# Patient Record
Sex: Female | Born: 1950
Health system: Southern US, Community
[De-identification: ages and names within clinical notes are randomized; demographics above are authoritative.]

## PROBLEM LIST (undated history)

## (undated) DIAGNOSIS — M810 Age-related osteoporosis without current pathological fracture: Secondary | ICD-10-CM

## (undated) DIAGNOSIS — E7800 Pure hypercholesterolemia, unspecified: Secondary | ICD-10-CM

## (undated) DIAGNOSIS — F419 Anxiety disorder, unspecified: Secondary | ICD-10-CM

## (undated) DIAGNOSIS — M1990 Unspecified osteoarthritis, unspecified site: Secondary | ICD-10-CM

## (undated) DIAGNOSIS — J189 Pneumonia, unspecified organism: Secondary | ICD-10-CM

## (undated) DIAGNOSIS — R7303 Prediabetes: Secondary | ICD-10-CM

## (undated) DIAGNOSIS — K219 Gastro-esophageal reflux disease without esophagitis: Secondary | ICD-10-CM

## (undated) HISTORY — DX: Pure hypercholesterolemia, unspecified: E78.00

## (undated) HISTORY — PX: FOOT SURGERY: SHX648

## (undated) HISTORY — PX: FINGER SURGERY: SHX640

## (undated) HISTORY — PX: PLANTAR FASCIA RELEASE: SHX2239

## (undated) HISTORY — DX: Age-related osteoporosis without current pathological fracture: M81.0

---

## 2000-06-01 ENCOUNTER — Other Ambulatory Visit: Admission: RE | Admit: 2000-06-01 | Discharge: 2000-06-01 | Payer: Self-pay | Admitting: Obstetrics and Gynecology

## 2001-05-03 ENCOUNTER — Other Ambulatory Visit: Admission: RE | Admit: 2001-05-03 | Discharge: 2001-05-03 | Payer: Self-pay | Admitting: Obstetrics and Gynecology

## 2001-07-06 ENCOUNTER — Encounter: Payer: Self-pay | Admitting: Emergency Medicine

## 2001-07-06 ENCOUNTER — Observation Stay (HOSPITAL_COMMUNITY): Admission: EM | Admit: 2001-07-06 | Discharge: 2001-07-07 | Payer: Self-pay | Admitting: Emergency Medicine

## 2002-08-13 ENCOUNTER — Other Ambulatory Visit: Admission: RE | Admit: 2002-08-13 | Discharge: 2002-08-13 | Payer: Self-pay | Admitting: Obstetrics and Gynecology

## 2003-08-19 ENCOUNTER — Other Ambulatory Visit: Admission: RE | Admit: 2003-08-19 | Discharge: 2003-08-19 | Payer: Self-pay | Admitting: Obstetrics and Gynecology

## 2004-08-20 ENCOUNTER — Other Ambulatory Visit: Admission: RE | Admit: 2004-08-20 | Discharge: 2004-08-20 | Payer: Self-pay | Admitting: Obstetrics and Gynecology

## 2005-08-22 ENCOUNTER — Other Ambulatory Visit: Admission: RE | Admit: 2005-08-22 | Discharge: 2005-08-22 | Payer: Self-pay | Admitting: Obstetrics and Gynecology

## 2010-11-17 ENCOUNTER — Other Ambulatory Visit: Payer: Self-pay | Admitting: Obstetrics and Gynecology

## 2010-11-17 ENCOUNTER — Ambulatory Visit
Admission: RE | Admit: 2010-11-17 | Discharge: 2010-11-17 | Disposition: A | Discharge status: Home / Self Care | Payer: BC Managed Care – PPO | Source: Ambulatory Visit | Attending: Obstetrics and Gynecology | Admitting: Obstetrics and Gynecology

## 2010-11-17 DIAGNOSIS — Z1231 Encounter for screening mammogram for malignant neoplasm of breast: Secondary | ICD-10-CM

## 2011-06-02 ENCOUNTER — Encounter: Payer: Self-pay | Admitting: Internal Medicine

## 2011-06-24 ENCOUNTER — Ambulatory Visit: Payer: BC Managed Care – PPO | Admitting: Internal Medicine

## 2011-11-21 ENCOUNTER — Other Ambulatory Visit: Payer: Self-pay | Admitting: Obstetrics and Gynecology

## 2011-11-21 DIAGNOSIS — Z1231 Encounter for screening mammogram for malignant neoplasm of breast: Secondary | ICD-10-CM

## 2011-12-09 ENCOUNTER — Ambulatory Visit: Payer: BC Managed Care – PPO

## 2011-12-20 ENCOUNTER — Ambulatory Visit
Admission: RE | Admit: 2011-12-20 | Discharge: 2011-12-20 | Disposition: A | Discharge status: Home / Self Care | Payer: BC Managed Care – PPO | Source: Ambulatory Visit | Attending: Obstetrics and Gynecology | Admitting: Obstetrics and Gynecology

## 2011-12-20 DIAGNOSIS — Z1231 Encounter for screening mammogram for malignant neoplasm of breast: Secondary | ICD-10-CM

## 2012-11-14 ENCOUNTER — Other Ambulatory Visit: Payer: Self-pay

## 2012-11-14 DIAGNOSIS — Z1231 Encounter for screening mammogram for malignant neoplasm of breast: Secondary | ICD-10-CM

## 2012-12-21 ENCOUNTER — Ambulatory Visit: Payer: BC Managed Care – PPO

## 2012-12-26 ENCOUNTER — Ambulatory Visit
Admission: RE | Admit: 2012-12-26 | Discharge: 2012-12-26 | Disposition: A | Discharge status: Home / Self Care | Payer: BC Managed Care – PPO | Source: Ambulatory Visit

## 2012-12-26 DIAGNOSIS — Z1231 Encounter for screening mammogram for malignant neoplasm of breast: Secondary | ICD-10-CM

## 2013-10-31 DIAGNOSIS — H25019 Cortical age-related cataract, unspecified eye: Secondary | ICD-10-CM | POA: Diagnosis not present

## 2013-10-31 DIAGNOSIS — H2510 Age-related nuclear cataract, unspecified eye: Secondary | ICD-10-CM | POA: Diagnosis not present

## 2013-10-31 DIAGNOSIS — H5210 Myopia, unspecified eye: Secondary | ICD-10-CM | POA: Diagnosis not present

## 2013-11-04 DIAGNOSIS — R12 Heartburn: Secondary | ICD-10-CM | POA: Diagnosis not present

## 2013-12-20 DIAGNOSIS — K2970 Gastritis, unspecified, without bleeding: Secondary | ICD-10-CM | POA: Diagnosis not present

## 2013-12-20 DIAGNOSIS — R12 Heartburn: Secondary | ICD-10-CM | POA: Diagnosis not present

## 2013-12-20 DIAGNOSIS — K2990 Gastroduodenitis, unspecified, without bleeding: Secondary | ICD-10-CM | POA: Diagnosis not present

## 2013-12-23 DIAGNOSIS — E782 Mixed hyperlipidemia: Secondary | ICD-10-CM | POA: Diagnosis not present

## 2013-12-23 DIAGNOSIS — F411 Generalized anxiety disorder: Secondary | ICD-10-CM | POA: Diagnosis not present

## 2013-12-23 DIAGNOSIS — F3289 Other specified depressive episodes: Secondary | ICD-10-CM | POA: Diagnosis not present

## 2013-12-23 DIAGNOSIS — Z23 Encounter for immunization: Secondary | ICD-10-CM | POA: Diagnosis not present

## 2013-12-23 DIAGNOSIS — F329 Major depressive disorder, single episode, unspecified: Secondary | ICD-10-CM | POA: Diagnosis not present

## 2013-12-23 DIAGNOSIS — Z1331 Encounter for screening for depression: Secondary | ICD-10-CM | POA: Diagnosis not present

## 2013-12-23 DIAGNOSIS — Z0000 Encounter for general adult medical examination without abnormal findings: Secondary | ICD-10-CM | POA: Diagnosis not present

## 2014-01-01 DIAGNOSIS — Z1231 Encounter for screening mammogram for malignant neoplasm of breast: Secondary | ICD-10-CM | POA: Diagnosis not present

## 2014-01-01 DIAGNOSIS — M899 Disorder of bone, unspecified: Secondary | ICD-10-CM | POA: Diagnosis not present

## 2014-01-01 DIAGNOSIS — Z803 Family history of malignant neoplasm of breast: Secondary | ICD-10-CM | POA: Diagnosis not present

## 2014-01-01 DIAGNOSIS — M949 Disorder of cartilage, unspecified: Secondary | ICD-10-CM | POA: Diagnosis not present

## 2014-01-14 DIAGNOSIS — M899 Disorder of bone, unspecified: Secondary | ICD-10-CM | POA: Diagnosis not present

## 2014-01-14 DIAGNOSIS — M949 Disorder of cartilage, unspecified: Secondary | ICD-10-CM | POA: Diagnosis not present

## 2014-01-14 DIAGNOSIS — Z23 Encounter for immunization: Secondary | ICD-10-CM | POA: Diagnosis not present

## 2014-04-03 DIAGNOSIS — M722 Plantar fascial fibromatosis: Secondary | ICD-10-CM | POA: Diagnosis not present

## 2014-04-03 DIAGNOSIS — M79672 Pain in left foot: Secondary | ICD-10-CM | POA: Diagnosis not present

## 2014-05-13 DIAGNOSIS — F329 Major depressive disorder, single episode, unspecified: Secondary | ICD-10-CM | POA: Diagnosis not present

## 2014-05-13 DIAGNOSIS — F419 Anxiety disorder, unspecified: Secondary | ICD-10-CM | POA: Diagnosis not present

## 2014-05-13 DIAGNOSIS — E782 Mixed hyperlipidemia: Secondary | ICD-10-CM | POA: Diagnosis not present

## 2014-06-10 DIAGNOSIS — E782 Mixed hyperlipidemia: Secondary | ICD-10-CM | POA: Diagnosis not present

## 2014-06-10 DIAGNOSIS — F329 Major depressive disorder, single episode, unspecified: Secondary | ICD-10-CM | POA: Diagnosis not present

## 2014-08-29 DIAGNOSIS — M65351 Trigger finger, right little finger: Secondary | ICD-10-CM | POA: Diagnosis not present

## 2014-10-23 DIAGNOSIS — Z1211 Encounter for screening for malignant neoplasm of colon: Secondary | ICD-10-CM | POA: Diagnosis not present

## 2014-10-23 DIAGNOSIS — R12 Heartburn: Secondary | ICD-10-CM | POA: Diagnosis not present

## 2014-12-19 DIAGNOSIS — Z1211 Encounter for screening for malignant neoplasm of colon: Secondary | ICD-10-CM | POA: Diagnosis not present

## 2014-12-19 DIAGNOSIS — D122 Benign neoplasm of ascending colon: Secondary | ICD-10-CM | POA: Diagnosis not present

## 2014-12-19 DIAGNOSIS — D126 Benign neoplasm of colon, unspecified: Secondary | ICD-10-CM | POA: Diagnosis not present

## 2015-01-02 DIAGNOSIS — M65351 Trigger finger, right little finger: Secondary | ICD-10-CM | POA: Diagnosis not present

## 2015-01-02 DIAGNOSIS — R2231 Localized swelling, mass and lump, right upper limb: Secondary | ICD-10-CM | POA: Diagnosis not present

## 2015-01-07 DIAGNOSIS — Z1231 Encounter for screening mammogram for malignant neoplasm of breast: Secondary | ICD-10-CM | POA: Diagnosis not present

## 2015-01-14 DIAGNOSIS — R2231 Localized swelling, mass and lump, right upper limb: Secondary | ICD-10-CM | POA: Diagnosis not present

## 2015-01-14 DIAGNOSIS — D481 Neoplasm of uncertain behavior of connective and other soft tissue: Secondary | ICD-10-CM | POA: Diagnosis not present

## 2015-01-14 DIAGNOSIS — Z23 Encounter for immunization: Secondary | ICD-10-CM | POA: Diagnosis not present

## 2015-01-14 DIAGNOSIS — M65351 Trigger finger, right little finger: Secondary | ICD-10-CM | POA: Diagnosis not present

## 2015-01-16 DIAGNOSIS — H04123 Dry eye syndrome of bilateral lacrimal glands: Secondary | ICD-10-CM | POA: Diagnosis not present

## 2015-01-16 DIAGNOSIS — H5213 Myopia, bilateral: Secondary | ICD-10-CM | POA: Diagnosis not present

## 2015-01-16 DIAGNOSIS — H25013 Cortical age-related cataract, bilateral: Secondary | ICD-10-CM | POA: Diagnosis not present

## 2015-01-16 DIAGNOSIS — H2513 Age-related nuclear cataract, bilateral: Secondary | ICD-10-CM | POA: Diagnosis not present

## 2015-01-26 DIAGNOSIS — Z4789 Encounter for other orthopedic aftercare: Secondary | ICD-10-CM | POA: Diagnosis not present

## 2015-02-03 DIAGNOSIS — M7582 Other shoulder lesions, left shoulder: Secondary | ICD-10-CM | POA: Diagnosis not present

## 2015-02-03 DIAGNOSIS — M7502 Adhesive capsulitis of left shoulder: Secondary | ICD-10-CM | POA: Diagnosis not present

## 2015-02-03 DIAGNOSIS — M7552 Bursitis of left shoulder: Secondary | ICD-10-CM | POA: Diagnosis not present

## 2015-02-03 DIAGNOSIS — M25512 Pain in left shoulder: Secondary | ICD-10-CM | POA: Diagnosis not present

## 2015-02-05 DIAGNOSIS — M7582 Other shoulder lesions, left shoulder: Secondary | ICD-10-CM | POA: Diagnosis not present

## 2015-02-09 DIAGNOSIS — M7582 Other shoulder lesions, left shoulder: Secondary | ICD-10-CM | POA: Diagnosis not present

## 2015-02-24 DIAGNOSIS — E782 Mixed hyperlipidemia: Secondary | ICD-10-CM | POA: Diagnosis not present

## 2015-02-24 DIAGNOSIS — M810 Age-related osteoporosis without current pathological fracture: Secondary | ICD-10-CM | POA: Diagnosis not present

## 2015-02-24 DIAGNOSIS — F329 Major depressive disorder, single episode, unspecified: Secondary | ICD-10-CM | POA: Diagnosis not present

## 2015-02-24 DIAGNOSIS — F419 Anxiety disorder, unspecified: Secondary | ICD-10-CM | POA: Diagnosis not present

## 2015-02-24 DIAGNOSIS — Z0000 Encounter for general adult medical examination without abnormal findings: Secondary | ICD-10-CM | POA: Diagnosis not present

## 2015-02-26 DIAGNOSIS — Z4789 Encounter for other orthopedic aftercare: Secondary | ICD-10-CM | POA: Diagnosis not present

## 2015-03-03 DIAGNOSIS — M7502 Adhesive capsulitis of left shoulder: Secondary | ICD-10-CM | POA: Diagnosis not present

## 2015-03-03 DIAGNOSIS — M25512 Pain in left shoulder: Secondary | ICD-10-CM | POA: Diagnosis not present

## 2015-03-03 DIAGNOSIS — M7552 Bursitis of left shoulder: Secondary | ICD-10-CM | POA: Diagnosis not present

## 2015-03-12 DIAGNOSIS — M25512 Pain in left shoulder: Secondary | ICD-10-CM | POA: Diagnosis not present

## 2015-03-17 DIAGNOSIS — M7502 Adhesive capsulitis of left shoulder: Secondary | ICD-10-CM | POA: Diagnosis not present

## 2015-04-09 DIAGNOSIS — Z4789 Encounter for other orthopedic aftercare: Secondary | ICD-10-CM | POA: Diagnosis not present

## 2015-04-09 DIAGNOSIS — M65351 Trigger finger, right little finger: Secondary | ICD-10-CM | POA: Diagnosis not present

## 2015-04-14 DIAGNOSIS — M7542 Impingement syndrome of left shoulder: Secondary | ICD-10-CM | POA: Diagnosis not present

## 2015-04-14 DIAGNOSIS — S43432D Superior glenoid labrum lesion of left shoulder, subsequent encounter: Secondary | ICD-10-CM | POA: Diagnosis not present

## 2015-04-14 DIAGNOSIS — M7502 Adhesive capsulitis of left shoulder: Secondary | ICD-10-CM | POA: Diagnosis not present

## 2015-05-13 DIAGNOSIS — M7502 Adhesive capsulitis of left shoulder: Secondary | ICD-10-CM | POA: Diagnosis not present

## 2015-05-13 DIAGNOSIS — S43432D Superior glenoid labrum lesion of left shoulder, subsequent encounter: Secondary | ICD-10-CM | POA: Diagnosis not present

## 2015-05-26 NOTE — H&P (Signed)
  [name] is an 65 y.o. female.    Chief Complaint: left shoulder pain  HPI: Pt is a 65 y.o. female complaining of left shoulder pain for multiple months. Pain had continually increased since the beginning. X-rays in the clinic show SLAP tear left shoulder. Pt has tried various conservative treatments which have failed to alleviate their symptoms, including injections and therapy. Various options are discussed with the patient. Risks, benefits and expectations were discussed with the patient. Patient understand the risks, benefits and expectations and wishes to proceed with surgery.   PCP:  No primary care provider on file.  D/C Plans: Home  PMH: No past medical history on file.  PSH: No past surgical history on file.  Social History:  has no tobacco, alcohol, and drug history on file.  Allergies:  No Known Allergies  Medications: No current facility-administered medications for this encounter.   No current outpatient prescriptions on file.    No results found for this or any previous visit (from the past 48 hour(s)). No results found.  ROS: Pain with rom of the left upper extremity  Physical Exam:  Alert and oriented 65 y.o. female in no acute distress Cranial nerves 2-12 intact Cervical spine: full rom with no tenderness, nv intact distally Chest: active breath sounds bilaterally, no wheeze rhonchi or rales Heart: regular rate and rhythm, no murmur Abd: non tender non distended with active bowel sounds Hip is stable with rom  Left shoulder with good rom nv intact distally Strength of ER and IR 5/5 bilaterally Positive obrien's test  Assessment/Plan Assessment: left shoulder SLAP tear  Plan: Patient will undergo a left shoulder scope and tenodesis by Dr. Veverly Fells at [facility] Risks benefits and expectations were discussed with the patient. Patient understand risks, benefits and expectations and wishes to proceed.

## 2015-06-02 ENCOUNTER — Encounter (HOSPITAL_COMMUNITY): Payer: Self-pay

## 2015-06-02 ENCOUNTER — Encounter (HOSPITAL_COMMUNITY)
Admission: RE | Admit: 2015-06-02 | Discharge: 2015-06-02 | Disposition: A | Discharge status: Home / Self Care | Payer: Medicare Other | Source: Ambulatory Visit | Attending: Orthopedic Surgery | Admitting: Orthopedic Surgery

## 2015-06-02 DIAGNOSIS — Z01812 Encounter for preprocedural laboratory examination: Secondary | ICD-10-CM | POA: Diagnosis not present

## 2015-06-02 DIAGNOSIS — S43432A Superior glenoid labrum lesion of left shoulder, initial encounter: Secondary | ICD-10-CM | POA: Insufficient documentation

## 2015-06-02 DIAGNOSIS — X58XXXA Exposure to other specified factors, initial encounter: Secondary | ICD-10-CM | POA: Insufficient documentation

## 2015-06-02 HISTORY — DX: Unspecified osteoarthritis, unspecified site: M19.90

## 2015-06-02 HISTORY — DX: Anxiety disorder, unspecified: F41.9

## 2015-06-02 HISTORY — DX: Gastro-esophageal reflux disease without esophagitis: K21.9

## 2015-06-02 LAB — CBC
HCT: 42.2 % (ref 36.0–46.0)
Hemoglobin: 13.3 g/dL (ref 12.0–15.0)
MCH: 27.7 pg (ref 26.0–34.0)
MCHC: 31.5 g/dL (ref 30.0–36.0)
MCV: 87.9 fL (ref 78.0–100.0)
Platelets: 202 10*3/uL (ref 150–400)
RBC: 4.8 MIL/uL (ref 3.87–5.11)
RDW: 13.8 % (ref 11.5–15.5)
WBC: 7.3 10*3/uL (ref 4.0–10.5)

## 2015-06-02 LAB — BASIC METABOLIC PANEL
Anion gap: 9 (ref 5–15)
BUN: 19 mg/dL (ref 6–20)
CALCIUM: 9 mg/dL (ref 8.9–10.3)
CO2: 27 mmol/L (ref 22–32)
CREATININE: 0.85 mg/dL (ref 0.44–1.00)
Chloride: 108 mmol/L (ref 101–111)
GFR calc non Af Amer: >60 mL/min (ref >60)
Glucose, Bld: 109 mg/dL — ABNORMAL HIGH (ref 65–99)
Potassium: 3.8 mmol/L (ref 3.5–5.1)
SODIUM: 144 mmol/L (ref 135–145)

## 2015-06-11 MED ORDER — CEFAZOLIN SODIUM-DEXTROSE 2-3 GM-% IV SOLR
2.0000 g | INTRAVENOUS | Status: AC
  Administered 2015-06-12: 2 g via INTRAVENOUS
  Filled 2015-06-11: qty 50

## 2015-06-11 MED ORDER — CHLORHEXIDINE GLUCONATE 4 % EX LIQD: 60.0000 mL | Freq: Once | CUTANEOUS | Status: DC

## 2015-06-12 ENCOUNTER — Ambulatory Visit (HOSPITAL_COMMUNITY): Payer: Medicare Other | Admitting: Anesthesiology

## 2015-06-12 ENCOUNTER — Encounter (HOSPITAL_COMMUNITY)
Admission: RE | Disposition: A | Discharge status: Home / Self Care | Payer: Self-pay | Source: Ambulatory Visit | Attending: Orthopedic Surgery

## 2015-06-12 ENCOUNTER — Encounter (HOSPITAL_COMMUNITY): Payer: Self-pay | Admitting: *Deleted

## 2015-06-12 ENCOUNTER — Ambulatory Visit (HOSPITAL_COMMUNITY)
Admission: RE | Admit: 2015-06-12 | Discharge: 2015-06-12 | Disposition: A | Discharge status: Home / Self Care | Payer: Medicare Other | Source: Ambulatory Visit | Attending: Orthopedic Surgery | Admitting: Orthopedic Surgery

## 2015-06-12 DIAGNOSIS — M75112 Incomplete rotator cuff tear or rupture of left shoulder, not specified as traumatic: Secondary | ICD-10-CM | POA: Insufficient documentation

## 2015-06-12 DIAGNOSIS — M7542 Impingement syndrome of left shoulder: Secondary | ICD-10-CM | POA: Diagnosis not present

## 2015-06-12 DIAGNOSIS — S43432A Superior glenoid labrum lesion of left shoulder, initial encounter: Secondary | ICD-10-CM | POA: Insufficient documentation

## 2015-06-12 DIAGNOSIS — M19012 Primary osteoarthritis, left shoulder: Secondary | ICD-10-CM | POA: Diagnosis not present

## 2015-06-12 DIAGNOSIS — X58XXXA Exposure to other specified factors, initial encounter: Secondary | ICD-10-CM | POA: Insufficient documentation

## 2015-06-12 DIAGNOSIS — G8918 Other acute postprocedural pain: Secondary | ICD-10-CM | POA: Diagnosis not present

## 2015-06-12 DIAGNOSIS — M75102 Unspecified rotator cuff tear or rupture of left shoulder, not specified as traumatic: Secondary | ICD-10-CM | POA: Diagnosis not present

## 2015-06-12 HISTORY — PX: SHOULDER ARTHROSCOPY WITH SUBACROMIAL DECOMPRESSION AND BICEP TENDON REPAIR: SHX5689

## 2015-06-12 SURGERY — SHOULDER ARTHROSCOPY WITH SUBACROMIAL DECOMPRESSION AND BICEP TENDON REPAIR
Anesthesia: General | Site: Shoulder | Laterality: Left

## 2015-06-12 MED ORDER — PROPOFOL 10 MG/ML IV BOLUS
INTRAVENOUS | Status: DC | PRN
  Administered 2015-06-12: 140 mg via INTRAVENOUS
  Administered 2015-06-12: 60 mg via INTRAVENOUS

## 2015-06-12 MED ORDER — BUPIVACAINE-EPINEPHRINE (PF) 0.5% -1:200000 IJ SOLN
INTRAMUSCULAR | Status: DC | PRN
  Administered 2015-06-12: 20 mL via PERINEURAL

## 2015-06-12 MED ORDER — FENTANYL CITRATE (PF) 100 MCG/2ML IJ SOLN
INTRAMUSCULAR | Status: AC
  Filled 2015-06-12: qty 2

## 2015-06-12 MED ORDER — MIDAZOLAM HCL 2 MG/2ML IJ SOLN
INTRAMUSCULAR | Status: AC
  Administered 2015-06-12: 1 mg
  Filled 2015-06-12: qty 2

## 2015-06-12 MED ORDER — PROMETHAZINE HCL 25 MG/ML IJ SOLN: 6.2500 mg | INTRAMUSCULAR | Status: DC | PRN

## 2015-06-12 MED ORDER — FENTANYL CITRATE (PF) 100 MCG/2ML IJ SOLN
50.0000 ug | Freq: Once | INTRAMUSCULAR | Status: AC
  Administered 2015-06-12: 50 ug via INTRAVENOUS

## 2015-06-12 MED ORDER — DEXAMETHASONE SODIUM PHOSPHATE 4 MG/ML IJ SOLN
INTRAMUSCULAR | Status: AC
  Filled 2015-06-12: qty 1

## 2015-06-12 MED ORDER — METHOCARBAMOL 500 MG PO TABS: 500.0000 mg | ORAL_TABLET | Freq: Three times a day (TID) | ORAL | Status: DC | PRN

## 2015-06-12 MED ORDER — BUPIVACAINE-EPINEPHRINE 0.25% -1:200000 IJ SOLN
INTRAMUSCULAR | Status: DC | PRN
  Administered 2015-06-12: 6 mL

## 2015-06-12 MED ORDER — FENTANYL CITRATE (PF) 100 MCG/2ML IJ SOLN: 25.0000 ug | INTRAMUSCULAR | Status: DC | PRN

## 2015-06-12 MED ORDER — LIDOCAINE HCL (CARDIAC) 20 MG/ML IV SOLN
INTRAVENOUS | Status: DC | PRN
  Administered 2015-06-12: 80 mg via INTRAVENOUS

## 2015-06-12 MED ORDER — ONDANSETRON HCL 4 MG/2ML IJ SOLN
INTRAMUSCULAR | Status: DC | PRN
  Administered 2015-06-12: 15 mg via INTRAVENOUS

## 2015-06-12 MED ORDER — BUPIVACAINE-EPINEPHRINE (PF) 0.25% -1:200000 IJ SOLN
INTRAMUSCULAR | Status: AC
  Filled 2015-06-12: qty 30

## 2015-06-12 MED ORDER — LIDOCAINE HCL (CARDIAC) 20 MG/ML IV SOLN
INTRAVENOUS | Status: AC
  Filled 2015-06-12: qty 5

## 2015-06-12 MED ORDER — NEOSTIGMINE METHYLSULFATE 10 MG/10ML IV SOLN
INTRAVENOUS | Status: DC | PRN
  Administered 2015-06-12: 3 mg via INTRAVENOUS

## 2015-06-12 MED ORDER — GLYCOPYRROLATE 0.2 MG/ML IJ SOLN
INTRAMUSCULAR | Status: AC
  Filled 2015-06-12: qty 2

## 2015-06-12 MED ORDER — PROPOFOL 10 MG/ML IV BOLUS
INTRAVENOUS | Status: AC
  Filled 2015-06-12: qty 20

## 2015-06-12 MED ORDER — NEOSTIGMINE METHYLSULFATE 10 MG/10ML IV SOLN
INTRAVENOUS | Status: AC
  Filled 2015-06-12: qty 1

## 2015-06-12 MED ORDER — ROCURONIUM BROMIDE 100 MG/10ML IV SOLN
INTRAVENOUS | Status: DC | PRN
  Administered 2015-06-12: 30 mg via INTRAVENOUS

## 2015-06-12 MED ORDER — KETOROLAC TROMETHAMINE 15 MG/ML IJ SOLN
INTRAMUSCULAR | Status: DC | PRN
  Administered 2015-06-12: 15 mg via INTRAVENOUS

## 2015-06-12 MED ORDER — DEXTROSE 5 % IV SOLN
10.0000 mg | INTRAVENOUS | Status: DC | PRN
  Administered 2015-06-12: 10 ug/min via INTRAVENOUS

## 2015-06-12 MED ORDER — MIDAZOLAM HCL 2 MG/2ML IJ SOLN
2.0000 mg | Freq: Once | INTRAMUSCULAR | Status: DC
Start: 2015-06-12 — End: 2015-06-12

## 2015-06-12 MED ORDER — LACTATED RINGERS IV SOLN
INTRAVENOUS | Status: DC
  Administered 2015-06-12: 08:00:00 via INTRAVENOUS

## 2015-06-12 MED ORDER — MEPERIDINE HCL 25 MG/ML IJ SOLN: 6.2500 mg | INTRAMUSCULAR | Status: DC | PRN

## 2015-06-12 MED ORDER — GLYCOPYRROLATE 0.2 MG/ML IJ SOLN
INTRAMUSCULAR | Status: DC | PRN
  Administered 2015-06-12: .4 mg via INTRAVENOUS

## 2015-06-12 MED ORDER — OXYCODONE-ACETAMINOPHEN 5-325 MG PO TABS: 1.0000 | ORAL_TABLET | ORAL | Status: DC | PRN

## 2015-06-12 MED ORDER — ROCURONIUM BROMIDE 50 MG/5ML IV SOLN
INTRAVENOUS | Status: AC
  Filled 2015-06-12: qty 1

## 2015-06-12 MED ORDER — FENTANYL CITRATE (PF) 250 MCG/5ML IJ SOLN
INTRAMUSCULAR | Status: AC
  Filled 2015-06-12: qty 5

## 2015-06-12 MED ORDER — ONDANSETRON HCL 4 MG/2ML IJ SOLN
INTRAMUSCULAR | Status: AC
  Filled 2015-06-12: qty 2

## 2015-06-12 MED ORDER — LACTATED RINGERS IV SOLN
INTRAVENOUS | Status: DC | PRN
  Administered 2015-06-12 (×2): via INTRAVENOUS

## 2015-06-12 MED ORDER — DEXAMETHASONE SODIUM PHOSPHATE 10 MG/ML IJ SOLN
INTRAMUSCULAR | Status: DC | PRN
  Administered 2015-06-12: 4 mg via INTRAVENOUS

## 2015-06-12 MED ORDER — SODIUM CHLORIDE 0.9 % IR SOLN
Status: DC | PRN
  Administered 2015-06-12: 6000 mL

## 2015-06-12 SURGICAL SUPPLY — 78 items
ANCH SUT 2 1.3X1 LD 1 STRN (Anchor) ×1 IMPLANT
ANCH SUT 360D 2 2 LD 2 STRN (Anchor) ×1 IMPLANT
ANCHOR ALL- SUT RC 2 SUT Y-K (Anchor) ×2 IMPLANT
ANCHOR ALL-SUT FLEX 1.3 Y-KNOT (Anchor) ×2 IMPLANT
ANCHOR ALL-SUT RC 2 SUT Y-K (Anchor) ×1 IMPLANT
BIT DRILL 1.3M DISPOSABLE (BIT) ×2 IMPLANT
BLADE LONG MED 31MMX9MM (MISCELLANEOUS)
BLADE LONG MED 31X9 (MISCELLANEOUS) IMPLANT
BLADE SURG 11 STRL SS (BLADE) ×3 IMPLANT
BUR OVAL 4.0 (BURR) ×3 IMPLANT
CLOSURE STERI-STRIP 1/2X4 (GAUZE/BANDAGES/DRESSINGS) ×1
CLOSURE WOUND 1/2 X4 (GAUZE/BANDAGES/DRESSINGS) ×1
CLSR STERI-STRIP ANTIMIC 1/2X4 (GAUZE/BANDAGES/DRESSINGS) ×1 IMPLANT
COVER SURGICAL LIGHT HANDLE (MISCELLANEOUS) ×3 IMPLANT
DRAPE INCISE IOBAN 66X45 STRL (DRAPES) ×3 IMPLANT
DRAPE STERI 35X30 U-POUCH (DRAPES) ×3 IMPLANT
DRAPE U-SHAPE 47X51 STRL (DRAPES) ×3 IMPLANT
DRILL BIT 5/64 (BIT) ×3 IMPLANT
DRSG EMULSION OIL 3X3 NADH (GAUZE/BANDAGES/DRESSINGS) ×3 IMPLANT
DRSG PAD ABDOMINAL 8X10 ST (GAUZE/BANDAGES/DRESSINGS) ×4 IMPLANT
DURAPREP 26ML APPLICATOR (WOUND CARE) ×3 IMPLANT
ELECT NDL TIP 2.8 STRL (NEEDLE) ×1 IMPLANT
ELECT NEEDLE TIP 2.8 STRL (NEEDLE) ×3 IMPLANT
ELECT REM PT RETURN 9FT ADLT (ELECTROSURGICAL)
ELECTRODE REM PT RTRN 9FT ADLT (ELECTROSURGICAL) IMPLANT
GAUZE SPONGE 4X4 12PLY STRL (GAUZE/BANDAGES/DRESSINGS) ×3 IMPLANT
GLOVE BIOGEL PI ORTHO PRO 7.5 (GLOVE) ×2
GLOVE BIOGEL PI ORTHO PRO SZ8 (GLOVE) ×2
GLOVE ORTHO TXT STRL SZ7.5 (GLOVE) ×3 IMPLANT
GLOVE PI ORTHO PRO STRL 7.5 (GLOVE) ×1 IMPLANT
GLOVE PI ORTHO PRO STRL SZ8 (GLOVE) ×1 IMPLANT
GLOVE SURG ORTHO 8.5 STRL (GLOVE) ×3 IMPLANT
GOWN STRL REUS W/ TWL LRG LVL3 (GOWN DISPOSABLE) ×2 IMPLANT
GOWN STRL REUS W/ TWL XL LVL3 (GOWN DISPOSABLE) ×2 IMPLANT
GOWN STRL REUS W/TWL LRG LVL3 (GOWN DISPOSABLE) ×6
GOWN STRL REUS W/TWL XL LVL3 (GOWN DISPOSABLE) ×6
KIT BASIN OR (CUSTOM PROCEDURE TRAY) ×3 IMPLANT
KIT ROOM TURNOVER OR (KITS) ×3 IMPLANT
MANIFOLD NEPTUNE II (INSTRUMENTS) ×3 IMPLANT
NDL 1/2 CIR MAYO (NEEDLE) IMPLANT
NDL HYPO 25GX1X1/2 BEV (NEEDLE) ×1 IMPLANT
NDL SUT 6 .5 CRC .975X.05 MAYO (NEEDLE) IMPLANT
NEEDLE 1/2 CIR MAYO (NEEDLE) IMPLANT
NEEDLE HYPO 25GX1X1/2 BEV (NEEDLE) ×3 IMPLANT
NEEDLE MAYO TAPER (NEEDLE)
NEEDLE SPNL 18GX3.5 QUINCKE PK (NEEDLE) ×3 IMPLANT
NS IRRIG 1000ML POUR BTL (IV SOLUTION) ×3 IMPLANT
PACK SHOULDER (CUSTOM PROCEDURE TRAY) ×3 IMPLANT
PAD ARMBOARD 7.5X6 YLW CONV (MISCELLANEOUS) ×6 IMPLANT
RESECTOR FULL RADIUS 4.2MM (BLADE) ×3 IMPLANT
SET ARTHROSCOPY TUBING (MISCELLANEOUS) ×3
SET ARTHROSCOPY TUBING LN (MISCELLANEOUS) ×1 IMPLANT
SLING ARM LRG ADULT FOAM STRAP (SOFTGOODS) ×3 IMPLANT
SLING ARM MED ADULT FOAM STRAP (SOFTGOODS) IMPLANT
SPONGE GAUZE 4X4 12PLY STER LF (GAUZE/BANDAGES/DRESSINGS) ×3 IMPLANT
SPONGE LAP 4X18 X RAY DECT (DISPOSABLE) ×3 IMPLANT
STRIP CLOSURE SKIN 1/2X4 (GAUZE/BANDAGES/DRESSINGS) ×2 IMPLANT
SUCTION FRAZIER HANDLE 10FR (MISCELLANEOUS) ×2
SUCTION TUBE FRAZIER 10FR DISP (MISCELLANEOUS) ×1 IMPLANT
SUT BONE WAX W31G (SUTURE) IMPLANT
SUT FIBERWIRE #2 38 T-5 BLUE (SUTURE)
SUT HI-FI 2 STRAND C-2 40 (SUTURE) ×2 IMPLANT
SUT MNCRL AB 4-0 PS2 18 (SUTURE) ×3 IMPLANT
SUT VIC AB 0 CT1 27 (SUTURE)
SUT VIC AB 0 CT1 27XBRD ANBCTR (SUTURE) IMPLANT
SUT VIC AB 0 CT2 27 (SUTURE) IMPLANT
SUT VIC AB 2-0 CT1 27 (SUTURE)
SUT VIC AB 2-0 CT1 TAPERPNT 27 (SUTURE) IMPLANT
SUT VICRYL 0 CT 1 36IN (SUTURE) ×9 IMPLANT
SUTURE FIBERWR #2 38 T-5 BLUE (SUTURE) IMPLANT
SYR CONTROL 10ML LL (SYRINGE) ×3 IMPLANT
TAPE CLOTH SURG 6X10 WHT LF (GAUZE/BANDAGES/DRESSINGS) ×2 IMPLANT
TOWEL OR 17X24 6PK STRL BLUE (TOWEL DISPOSABLE) ×3 IMPLANT
TOWEL OR 17X26 10 PK STRL BLUE (TOWEL DISPOSABLE) ×3 IMPLANT
TUBE CONNECTING 12'X1/4 (SUCTIONS) ×1
TUBE CONNECTING 12X1/4 (SUCTIONS) ×2 IMPLANT
WAND HAND CNTRL MULTIVAC 90 (MISCELLANEOUS) ×3 IMPLANT
WATER STERILE IRR 1000ML POUR (IV SOLUTION) ×3 IMPLANT

## 2015-06-12 NOTE — Interval H&P Note (Signed)
History and Physical Interval Note:  06/12/2015 9:00 AM  [name]  has presented today for surgery, with the diagnosis of LEFT SHOULDER SLAP, BICEPS SPLIT TEAR, ROTATOR CUFF IMPINGEMENT, GLENO HUMERAL DEGENERATIVE JOINT DISEASE  The various methods of treatment have been discussed with the patient and family. After consideration of risks, benefits and other options for treatment, the patient has consented to  Procedure(s): LEFT SHOULDER ARTHROSCOPY WITH DEBRIDEMENT OF SLAP, SUBACROMIAL DECOMPRESSION AND BICEP TENODESIS (Left) as a surgical intervention .  The patient's history has been reviewed, patient examined, no change in status, stable for surgery.  I have reviewed the patient's chart and labs.  Questions were answered to the patient's satisfaction.     Aysiah Jurado,STEVEN R

## 2015-06-12 NOTE — Transfer of Care (Signed)
Immediate Anesthesia Transfer of Care Note  Patient: [name]  Procedure(s) Performed: Procedure(s): LEFT SHOULDER ARTHROSCOPY WITH DEBRIDEMENT OF SLAP, SUBACROMIAL DECOMPRESSION AND BICEP TENODESIS (Left)  Patient Location: PACU  Anesthesia Type:General  Level of Consciousness: awake, alert , oriented and patient cooperative  Airway & Oxygen Therapy: Patient Spontanous Breathing  Post-op Assessment: Report given to RN, Post -op Vital signs reviewed and stable, Patient moving all extremities and Patient moving all extremities X 4  Post vital signs: Reviewed and stable  Last Vitals:  Filed Vitals:   06/12/15 0900 06/12/15 0903  BP: 148/69   Pulse: 83 88  Temp:    Resp: 26 17    Complications: No apparent anesthesia complications

## 2015-06-12 NOTE — Brief Op Note (Signed)
06/12/2015  11:36 AM  PATIENT:  [name]  65 y.o. female  PRE-OPERATIVE DIAGNOSIS:  LEFT SHOULDER SLAP, BICEPS SPLIT TEAR, ROTATOR CUFF IMPINGEMENT, GLENO HUMERAL DEGENERATIVE JOINT DISEASE  POST-OPERATIVE DIAGNOSIS:  LEFT SHOULDER SLAP, BICEPS SPLIT TEAR, ROTATOR CUFF TEAR, GLENO HUMERAL DEGENERATIVE JOINT DISEASE  PROCEDURE:  Procedure(s): LEFT SHOULDER ARTHROSCOPY WITH DEBRIDEMENT OF SLAP, SUBACROMIAL DECOMPRESSION AND BICEP TENODESIS (Left) MINI OPEN ROTATOR CUFF REPAIR  SURGEON:  Surgeon(s) and Role:    * Netta Cedars, MD - Primary  PHYSICIAN ASSISTANT:   ASSISTANTS: Ventura Bruns, PA-C   ANESTHESIA:   regional and general  EBL:  Total I/O In: 1000 [I.V.:1000] Out: -   BLOOD ADMINISTERED:none  DRAINS: none   LOCAL MEDICATIONS USED:  MARCAINE     SPECIMEN:  No Specimen  DISPOSITION OF SPECIMEN:  N/A  COUNTS:  YES  TOURNIQUET:  * No tourniquets in log *  DICTATION: .Other Dictation: Dictation Number [id number]  PLAN OF CARE: Discharge to home after PACU  PATIENT DISPOSITION:  PACU - hemodynamically stable.   Delay start of Pharmacological VTE agent (>24hrs) due to surgical blood loss or risk of bleeding: not applicable

## 2015-06-12 NOTE — Anesthesia Preprocedure Evaluation (Signed)
Anesthesia Evaluation  Patient identified by MRN, date of birth, ID band Patient awake    Reviewed: Allergy & Precautions, NPO status , Patient's Chart, lab work & pertinent test results  Airway Mallampati: II  TM Distance: >3 FB Neck ROM: Full    Dental no notable dental hx.    Pulmonary neg pulmonary ROS,    Pulmonary exam normal breath sounds clear to auscultation       Cardiovascular negative cardio ROS Normal cardiovascular exam Rhythm:Regular Rate:Normal     Neuro/Psych negative neurological ROS  negative psych ROS   GI/Hepatic negative GI ROS, Neg liver ROS,   Endo/Other  negative endocrine ROS  Renal/GU negative Renal ROS  negative genitourinary   Musculoskeletal negative musculoskeletal ROS (+)   Abdominal   Peds negative pediatric ROS (+)  Hematology negative hematology ROS (+)   Anesthesia Other Findings   Reproductive/Obstetrics negative OB ROS                             Anesthesia Physical Anesthesia Plan  ASA: II  Anesthesia Plan: General   Post-op Pain Management: GA combined w/ Regional for post-op pain   Induction: Intravenous  Airway Management Planned: Oral ETT  Additional Equipment:   Intra-op Plan:   Post-operative Plan: Extubation in OR  Informed Consent: I have reviewed the patients History and Physical, chart, labs and discussed the procedure including the risks, benefits and alternatives for the proposed anesthesia with the patient or authorized representative who has indicated his/her understanding and acceptance.   Dental advisory given  Plan Discussed with: CRNA  Anesthesia Plan Comments: (L SCB)        Anesthesia Quick Evaluation

## 2015-06-12 NOTE — Anesthesia Postprocedure Evaluation (Signed)
Anesthesia Post Note  Patient: [name]  Procedure(s) Performed: Procedure(s) (LRB): LEFT SHOULDER ARTHROSCOPY WITH DEBRIDEMENT OF SLAP, SUBACROMIAL DECOMPRESSION AND BICEP TENODESIS (Left)  Patient location during evaluation: PACU Anesthesia Type: General and Regional Level of consciousness: awake and alert Pain management: pain level controlled Vital Signs Assessment: post-procedure vital signs reviewed and stable Respiratory status: spontaneous breathing, nonlabored ventilation, respiratory function stable and patient connected to nasal cannula oxygen Cardiovascular status: blood pressure returned to baseline and stable Postop Assessment: no signs of nausea or vomiting Anesthetic complications: no    Last Vitals:  Filed Vitals:   06/12/15 1123 06/12/15 1130  BP: 96/76 112/59  Pulse: 37 50  Temp: 36.6 C   Resp: 10 13    Last Pain: There were no vitals filed for this visit.               Montez Hageman

## 2015-06-12 NOTE — Discharge Instructions (Signed)
Ice to the shoulder as much as you can.  Remove the sling in the home and hug a pillow to rest the arm.  Do exercises every hour while awake to prevent stiffness.  Pendulums, Lap Slides, Door Hinge exercises - Hug yourself then rotate the arm away from you (hitchhike) ,  Do assisted forward elevation - use the right arm to assist elevating the left one.  Do not push pull or lift  Start pain meds before the block wears off.  Keep the incision clean and dry and covered for one week, then ok to get wet in the shower.  Ok to change to BandAids on Monday.  Change daily  Follow up in two weeks in the office  [phone]

## 2015-06-12 NOTE — Anesthesia Procedure Notes (Addendum)
Anesthesia Regional Block:  Supraclavicular block  Pre-Anesthetic Checklist: ,, timeout performed, Correct Patient, Correct Site, Correct Laterality, Correct Procedure, Correct Position, site marked, Risks and benefits discussed,  Surgical consent,  Pre-op evaluation,  At surgeon's request and post-op pain management   Prep: Maximum Sterile Barrier Precautions used and Dura Prep       Needles:  Injection technique: Single-shot  Needle Type: Stimiplex     Needle Length: 10cm 10 cm Needle Gauge: 21 and 21 G    Additional Needles:  Procedures: ultrasound guided (picture in chart) Supraclavicular block Narrative:  Injection made incrementally with aspirations every 5 mL.  Performed by: Personally   Additional Notes: Risks, benefits and alternative to block explained extensively.  Patient tolerated procedure well, without complications.   Procedure Name: Intubation Date/Time: 06/12/2015 9:27 AM Performed by: Izora Gala Pre-anesthesia Checklist: Patient identified, Emergency Drugs available, Suction available and Patient being monitored Patient Re-evaluated:Patient Re-evaluated prior to inductionOxygen Delivery Method: Circle system utilized Preoxygenation: Pre-oxygenation with 100% oxygen Intubation Type: IV induction Ventilation: Mask ventilation with difficulty Laryngoscope Size: Miller and 3 Grade View: Grade I Tube type: Oral Tube size: 7.5 mm Number of attempts: 1 Airway Equipment and Method: Stylet and LTA kit utilized Placement Confirmation: ETT inserted through vocal cords under direct vision,  positive ETCO2 and breath sounds checked- equal and bilateral Secured at: 20 cm Dental Injury: Teeth and Oropharynx as per pre-operative assessment

## 2015-06-13 NOTE — Op Note (Signed)
[name], [name] NO.:  [id number]  MEDICAL RECORD NO.:  [id number]  LOCATION:  [facility]                         FACILITY:  [facility]  PHYSICIAN:  Doran Heater. Veverly Fells, M.D. DATE OF BIRTH:  January 17, 1951  DATE OF PROCEDURE:  06/12/2015 DATE OF DISCHARGE:  06/12/2015                              OPERATIVE REPORT   PREOPERATIVE DIAGNOSES:  Left shoulder pain secondary to glenohumeral arthritis, degenerative labral tear, SLAP tear, biceps split tear as well as a chronic subacromial impingement.  POSTOPERATIVE DIAGNOSES: 1. Left shoulder glenohumeral arthritis. 2. Left shoulder SLAP tear and pan-labral tear. 3. Subacromial impingement with full-thickness rotator cuff tear. 4. Frozen shoulder.  PROCEDURE PERFORMED:  Left shoulder arthroscopy with glenohumeral chondroplasty not to bleeding bone, also debridement of SLAP tear and pan-labral tear, arthroscopic biceps tenotomy, arthroscopic subacromial decompression followed by mini-open rotator cuff repair, biceps tenodesis in the groove, and arthroscopic capsule release.  ATTENDING SURGEON:  Doran Heater. Veverly Fells, MD.  ASSISTANT:  Abbott Pao. Dixon, PA-C, who was scrubbed the entire procedure and necessary for satisfactory completion of surgery.  ANESTHESIA:  General anesthesia was used plus interscalene block.  ESTIMATED BLOOD LOSS:  Minimal.  FLUID REPLACEMENT:  1200 mL crystalloid.  INSTRUMENT COUNTS:  Correct.  COMPLICATIONS:  There were no complications.  ANTIBIOTICS:  Perioperative antibiotics were given.  INDICATIONS:  The patient is a 65 year old female with worsening left shoulder pain and dysfunction secondary to chronic shoulder impingement as well as a SLAP tear and possibly a biceps split tear.  The patient also has known glenohumeral arthritis on MRI.  We discussed options of management including continued conservative management versus surgical treatment.  Given that she is failing conservative management  with injections, modification of activity, therapy, anti-inflammatories, and pain medication; she would like to proceed with surgery.  Risks and benefits were explained.  Informed consent obtained.  DESCRIPTION OF PROCEDURE:  After an adequate level of anesthesia achieved, the patient was positioned in the modified beach chair position.  Left shoulder was correctly identified and examined under anesthesia.  Time-out was called.  We did a passive range of motion manipulation of her shoulder, she did have a little bit of stiffness in the shoulder and range.  We manipulated and improved that.  Once we performed our exam under anesthesia, a time-out, our manipulation under anesthesia regaining motion, we went ahead and sterilely prepped and draped the shoulder and arm.  We re-verified correct patient and correct site and then entered the shoulder using standard portals including anterior, posterior, and lateral portals.  We identified significant synovitis throughout the shoulder joint.  She was still somewhat stiff and had a thickened anterior-inferior capsule; down in the inferior capsule, we did a capsular release with the ArthroCare unit.  She had a pretty bad labral tear, superior labral tear compromised from the biceps anchor.  We performed a biceps tenotomy and labral debridement back to stable labral rim.  There was some pan-labral tearing posteriorly, which we also debrided using suction shaver and then down anteriorly and inferiorly.  There was some grade 4 chondromalacia noted on the glenoid, but it was pretty small limited area posteriorly.  There were some loose flaps  on the edges.  We did smooth that down using suction shaver and there was about 1 x 1 cm area on the humeral head near the subscap up front, where there was some delaminated cartilage which was removed using suction shaver.  Rotator cuff from the posterior view looking forward looked like it was a partial-thickness  tear, maybe 50% of tendon.  We reversed the scope, looking posteriorly.  It appeared to be deeper in the infraspinatus.  We then placed a scope in subacromial space.  Performed a thorough arthroscopic bursectomy and acromioplasty creating a type 1 acromial shape with a butcher block technique utilizing a high-speed burr.  We really had a nice decompression of rotator cuff outlet.  We noticed, however, there was a full-thickness rotator cuff tear noted in the infraspinatus-supraspinatus junction. Once we concluded the arthroscopic portion of the procedure, we made a small mini-open incision starting at the anterolateral border of the acromion and staying distally about 4 cm, dissection down to the subcutaneous tissues, we identified the deltoid raphe between the anterior and lateral heads of the deltoid and divided that sharply using needle-tip Bovie.  We placed Arthrex traction, identified the biceps groove, delivered the tendon out of wound.  Prepared the 4 of the biceps screws with a needle-tip Bovie and a Soil scientist scraping at the soft tissue lining on the floor of the biceps groove to encourage healing. Placed Y-Knot Flex anchor to the floor of the biceps groove, brought the suture up through the biceps tendon, mid tension with the elbow at 90 degrees.  We had nice low-profile tenodesis.  We over sewed more distal that with 0 Vicryl, figure-of-eight to incorporate some soft tissue in the sheath and then the biceps tendon and a little bit of upper pack. Once we had that tenodesis completed, we went ahead and moved on to the rotator cuff tear, it was clearly evident to be a full-thickness tear, was able to mobilize that tendon with #2 Hi-Fi suture and a Cobb elevator on the bursal and joint surfaces.  Once we get the cuff mobilized back into position, we prepared the greater tuberosity with a curette to get back to bleeding bone.  Placed a ConMed Linvatec Y-Knot RC anchor at that  articular margin, we brought the sutures up in a double mattress fashion.  We also sewed the 4 lateral sutures in the tendon through drill holes in the bone and tied them over lateral bone bridge.  We had a very nice full-thickness repair watertight.  Ranged the shoulder; no impingement, no gapping.  We then irrigated the subdeltoid-subacromial interval and repaired deltoid anatomically with 0 Vicryl suture followed by 2-0 Vicryl, subcutaneous closure and 4-0 Monocryl for skin and portals.  Steri-Strips applied followed by sterile dressing.  The patient tolerated the surgery well.     Doran Heater. Veverly Fells, M.D.     SRN/MEDQ  D:  06/12/2015  T:  06/13/2015  Job:  [id number]

## 2015-06-15 ENCOUNTER — Encounter (HOSPITAL_COMMUNITY): Payer: Self-pay | Admitting: Orthopedic Surgery

## 2015-06-15 DIAGNOSIS — M25512 Pain in left shoulder: Secondary | ICD-10-CM | POA: Diagnosis not present

## 2015-06-17 DIAGNOSIS — M25512 Pain in left shoulder: Secondary | ICD-10-CM | POA: Diagnosis not present

## 2015-06-19 DIAGNOSIS — M25512 Pain in left shoulder: Secondary | ICD-10-CM | POA: Diagnosis not present

## 2015-06-23 DIAGNOSIS — M25512 Pain in left shoulder: Secondary | ICD-10-CM | POA: Diagnosis not present

## 2015-06-23 DIAGNOSIS — Z4789 Encounter for other orthopedic aftercare: Secondary | ICD-10-CM | POA: Diagnosis not present

## 2015-06-30 DIAGNOSIS — M25512 Pain in left shoulder: Secondary | ICD-10-CM | POA: Diagnosis not present

## 2015-07-03 DIAGNOSIS — M25512 Pain in left shoulder: Secondary | ICD-10-CM | POA: Diagnosis not present

## 2015-07-06 DIAGNOSIS — M25512 Pain in left shoulder: Secondary | ICD-10-CM | POA: Diagnosis not present

## 2015-07-07 DIAGNOSIS — R5383 Other fatigue: Secondary | ICD-10-CM | POA: Diagnosis not present

## 2015-07-07 DIAGNOSIS — M810 Age-related osteoporosis without current pathological fracture: Secondary | ICD-10-CM | POA: Diagnosis not present

## 2015-07-07 DIAGNOSIS — R7301 Impaired fasting glucose: Secondary | ICD-10-CM | POA: Diagnosis not present

## 2015-07-07 DIAGNOSIS — R202 Paresthesia of skin: Secondary | ICD-10-CM | POA: Diagnosis not present

## 2015-07-08 DIAGNOSIS — M25512 Pain in left shoulder: Secondary | ICD-10-CM | POA: Diagnosis not present

## 2015-07-13 DIAGNOSIS — M25512 Pain in left shoulder: Secondary | ICD-10-CM | POA: Diagnosis not present

## 2015-07-16 DIAGNOSIS — M25512 Pain in left shoulder: Secondary | ICD-10-CM | POA: Diagnosis not present

## 2015-07-21 DIAGNOSIS — M25512 Pain in left shoulder: Secondary | ICD-10-CM | POA: Diagnosis not present

## 2015-07-21 DIAGNOSIS — Z4789 Encounter for other orthopedic aftercare: Secondary | ICD-10-CM | POA: Diagnosis not present

## 2015-07-30 DIAGNOSIS — M25512 Pain in left shoulder: Secondary | ICD-10-CM | POA: Diagnosis not present

## 2015-08-06 DIAGNOSIS — M25512 Pain in left shoulder: Secondary | ICD-10-CM | POA: Diagnosis not present

## 2015-08-21 DIAGNOSIS — M170 Bilateral primary osteoarthritis of knee: Secondary | ICD-10-CM | POA: Diagnosis not present

## 2015-08-21 DIAGNOSIS — M1712 Unilateral primary osteoarthritis, left knee: Secondary | ICD-10-CM | POA: Diagnosis not present

## 2015-08-25 DIAGNOSIS — F419 Anxiety disorder, unspecified: Secondary | ICD-10-CM | POA: Diagnosis not present

## 2015-08-25 DIAGNOSIS — R202 Paresthesia of skin: Secondary | ICD-10-CM | POA: Diagnosis not present

## 2015-08-25 DIAGNOSIS — E782 Mixed hyperlipidemia: Secondary | ICD-10-CM | POA: Diagnosis not present

## 2015-08-25 DIAGNOSIS — F329 Major depressive disorder, single episode, unspecified: Secondary | ICD-10-CM | POA: Diagnosis not present

## 2015-08-27 DIAGNOSIS — M25512 Pain in left shoulder: Secondary | ICD-10-CM | POA: Diagnosis not present

## 2015-09-01 DIAGNOSIS — Z4789 Encounter for other orthopedic aftercare: Secondary | ICD-10-CM | POA: Diagnosis not present

## 2015-09-01 DIAGNOSIS — S43432D Superior glenoid labrum lesion of left shoulder, subsequent encounter: Secondary | ICD-10-CM | POA: Diagnosis not present

## 2015-09-14 DIAGNOSIS — E782 Mixed hyperlipidemia: Secondary | ICD-10-CM | POA: Diagnosis not present

## 2015-10-02 DIAGNOSIS — M170 Bilateral primary osteoarthritis of knee: Secondary | ICD-10-CM | POA: Diagnosis not present

## 2015-11-05 DIAGNOSIS — M170 Bilateral primary osteoarthritis of knee: Secondary | ICD-10-CM | POA: Diagnosis not present

## 2015-11-10 DIAGNOSIS — S76312A Strain of muscle, fascia and tendon of the posterior muscle group at thigh level, left thigh, initial encounter: Secondary | ICD-10-CM | POA: Diagnosis not present

## 2015-11-10 DIAGNOSIS — E782 Mixed hyperlipidemia: Secondary | ICD-10-CM | POA: Diagnosis not present

## 2015-11-10 DIAGNOSIS — M7052 Other bursitis of knee, left knee: Secondary | ICD-10-CM | POA: Diagnosis not present

## 2015-12-25 DIAGNOSIS — M1712 Unilateral primary osteoarthritis, left knee: Secondary | ICD-10-CM | POA: Diagnosis not present

## 2016-01-01 DIAGNOSIS — M1712 Unilateral primary osteoarthritis, left knee: Secondary | ICD-10-CM | POA: Diagnosis not present

## 2016-01-09 DIAGNOSIS — M1712 Unilateral primary osteoarthritis, left knee: Secondary | ICD-10-CM | POA: Diagnosis not present

## 2016-01-18 DIAGNOSIS — Z1231 Encounter for screening mammogram for malignant neoplasm of breast: Secondary | ICD-10-CM | POA: Diagnosis not present

## 2016-01-18 DIAGNOSIS — H10022 Other mucopurulent conjunctivitis, left eye: Secondary | ICD-10-CM | POA: Diagnosis not present

## 2016-01-18 DIAGNOSIS — M8589 Other specified disorders of bone density and structure, multiple sites: Secondary | ICD-10-CM | POA: Diagnosis not present

## 2016-01-22 DIAGNOSIS — H04123 Dry eye syndrome of bilateral lacrimal glands: Secondary | ICD-10-CM | POA: Diagnosis not present

## 2016-01-22 DIAGNOSIS — H35033 Hypertensive retinopathy, bilateral: Secondary | ICD-10-CM | POA: Diagnosis not present

## 2016-01-22 DIAGNOSIS — H01003 Unspecified blepharitis right eye, unspecified eyelid: Secondary | ICD-10-CM | POA: Diagnosis not present

## 2016-01-22 DIAGNOSIS — H2513 Age-related nuclear cataract, bilateral: Secondary | ICD-10-CM | POA: Diagnosis not present

## 2016-02-04 DIAGNOSIS — Z23 Encounter for immunization: Secondary | ICD-10-CM | POA: Diagnosis not present

## 2016-02-04 DIAGNOSIS — H1013 Acute atopic conjunctivitis, bilateral: Secondary | ICD-10-CM | POA: Diagnosis not present

## 2016-02-04 DIAGNOSIS — J309 Allergic rhinitis, unspecified: Secondary | ICD-10-CM | POA: Diagnosis not present

## 2016-02-22 DIAGNOSIS — M1712 Unilateral primary osteoarthritis, left knee: Secondary | ICD-10-CM | POA: Diagnosis not present

## 2016-02-29 DIAGNOSIS — E782 Mixed hyperlipidemia: Secondary | ICD-10-CM | POA: Diagnosis not present

## 2016-02-29 DIAGNOSIS — Z1389 Encounter for screening for other disorder: Secondary | ICD-10-CM | POA: Diagnosis not present

## 2016-02-29 DIAGNOSIS — Z23 Encounter for immunization: Secondary | ICD-10-CM | POA: Diagnosis not present

## 2016-02-29 DIAGNOSIS — F329 Major depressive disorder, single episode, unspecified: Secondary | ICD-10-CM | POA: Diagnosis not present

## 2016-02-29 DIAGNOSIS — M8589 Other specified disorders of bone density and structure, multiple sites: Secondary | ICD-10-CM | POA: Diagnosis not present

## 2016-02-29 DIAGNOSIS — K219 Gastro-esophageal reflux disease without esophagitis: Secondary | ICD-10-CM | POA: Diagnosis not present

## 2016-02-29 DIAGNOSIS — Z0000 Encounter for general adult medical examination without abnormal findings: Secondary | ICD-10-CM | POA: Diagnosis not present

## 2016-07-27 DIAGNOSIS — N390 Urinary tract infection, site not specified: Secondary | ICD-10-CM | POA: Diagnosis not present

## 2016-07-27 DIAGNOSIS — R300 Dysuria: Secondary | ICD-10-CM | POA: Diagnosis not present

## 2016-08-01 DIAGNOSIS — N762 Acute vulvitis: Secondary | ICD-10-CM | POA: Diagnosis not present

## 2016-08-01 DIAGNOSIS — R350 Frequency of micturition: Secondary | ICD-10-CM | POA: Diagnosis not present

## 2016-08-29 DIAGNOSIS — M129 Arthropathy, unspecified: Secondary | ICD-10-CM | POA: Diagnosis not present

## 2016-08-29 DIAGNOSIS — K219 Gastro-esophageal reflux disease without esophagitis: Secondary | ICD-10-CM | POA: Diagnosis not present

## 2016-08-29 DIAGNOSIS — F329 Major depressive disorder, single episode, unspecified: Secondary | ICD-10-CM | POA: Diagnosis not present

## 2016-08-29 DIAGNOSIS — E782 Mixed hyperlipidemia: Secondary | ICD-10-CM | POA: Diagnosis not present

## 2016-09-01 DIAGNOSIS — H43391 Other vitreous opacities, right eye: Secondary | ICD-10-CM | POA: Diagnosis not present

## 2016-09-01 DIAGNOSIS — H43811 Vitreous degeneration, right eye: Secondary | ICD-10-CM | POA: Diagnosis not present

## 2017-01-18 DIAGNOSIS — Z803 Family history of malignant neoplasm of breast: Secondary | ICD-10-CM | POA: Diagnosis not present

## 2017-01-18 DIAGNOSIS — Z1231 Encounter for screening mammogram for malignant neoplasm of breast: Secondary | ICD-10-CM | POA: Diagnosis not present

## 2017-01-27 DIAGNOSIS — H35033 Hypertensive retinopathy, bilateral: Secondary | ICD-10-CM | POA: Diagnosis not present

## 2017-01-27 DIAGNOSIS — H04123 Dry eye syndrome of bilateral lacrimal glands: Secondary | ICD-10-CM | POA: Diagnosis not present

## 2017-01-27 DIAGNOSIS — H25013 Cortical age-related cataract, bilateral: Secondary | ICD-10-CM | POA: Diagnosis not present

## 2017-01-27 DIAGNOSIS — H2513 Age-related nuclear cataract, bilateral: Secondary | ICD-10-CM | POA: Diagnosis not present

## 2017-01-27 DIAGNOSIS — H43811 Vitreous degeneration, right eye: Secondary | ICD-10-CM | POA: Diagnosis not present

## 2017-02-07 DIAGNOSIS — Z23 Encounter for immunization: Secondary | ICD-10-CM | POA: Diagnosis not present

## 2017-03-06 DIAGNOSIS — E782 Mixed hyperlipidemia: Secondary | ICD-10-CM | POA: Diagnosis not present

## 2017-03-06 DIAGNOSIS — M129 Arthropathy, unspecified: Secondary | ICD-10-CM | POA: Diagnosis not present

## 2017-03-06 DIAGNOSIS — Z0000 Encounter for general adult medical examination without abnormal findings: Secondary | ICD-10-CM | POA: Diagnosis not present

## 2017-03-06 DIAGNOSIS — Z6830 Body mass index (BMI) 30.0-30.9, adult: Secondary | ICD-10-CM | POA: Diagnosis not present

## 2017-03-06 DIAGNOSIS — F329 Major depressive disorder, single episode, unspecified: Secondary | ICD-10-CM | POA: Diagnosis not present

## 2017-03-06 DIAGNOSIS — Z1389 Encounter for screening for other disorder: Secondary | ICD-10-CM | POA: Diagnosis not present

## 2017-03-06 DIAGNOSIS — K219 Gastro-esophageal reflux disease without esophagitis: Secondary | ICD-10-CM | POA: Diagnosis not present

## 2017-03-06 DIAGNOSIS — F419 Anxiety disorder, unspecified: Secondary | ICD-10-CM | POA: Diagnosis not present

## 2017-05-30 DIAGNOSIS — N760 Acute vaginitis: Secondary | ICD-10-CM | POA: Diagnosis not present

## 2017-05-30 DIAGNOSIS — N952 Postmenopausal atrophic vaginitis: Secondary | ICD-10-CM | POA: Diagnosis not present

## 2017-05-30 DIAGNOSIS — N898 Other specified noninflammatory disorders of vagina: Secondary | ICD-10-CM | POA: Diagnosis not present

## 2017-06-28 DIAGNOSIS — R05 Cough: Secondary | ICD-10-CM | POA: Diagnosis not present

## 2017-06-28 DIAGNOSIS — F419 Anxiety disorder, unspecified: Secondary | ICD-10-CM | POA: Diagnosis not present

## 2017-08-03 DIAGNOSIS — M1712 Unilateral primary osteoarthritis, left knee: Secondary | ICD-10-CM | POA: Insufficient documentation

## 2017-08-03 DIAGNOSIS — M25532 Pain in left wrist: Secondary | ICD-10-CM | POA: Diagnosis not present

## 2017-09-07 DIAGNOSIS — M654 Radial styloid tenosynovitis [de Quervain]: Secondary | ICD-10-CM | POA: Insufficient documentation

## 2017-09-26 DIAGNOSIS — D692 Other nonthrombocytopenic purpura: Secondary | ICD-10-CM | POA: Diagnosis not present

## 2017-09-26 DIAGNOSIS — L821 Other seborrheic keratosis: Secondary | ICD-10-CM | POA: Diagnosis not present

## 2017-09-26 DIAGNOSIS — L820 Inflamed seborrheic keratosis: Secondary | ICD-10-CM | POA: Diagnosis not present

## 2017-09-26 DIAGNOSIS — L918 Other hypertrophic disorders of the skin: Secondary | ICD-10-CM | POA: Diagnosis not present

## 2017-09-26 DIAGNOSIS — D485 Neoplasm of uncertain behavior of skin: Secondary | ICD-10-CM | POA: Diagnosis not present

## 2017-09-27 DIAGNOSIS — R7309 Other abnormal glucose: Secondary | ICD-10-CM | POA: Diagnosis not present

## 2017-09-27 DIAGNOSIS — K219 Gastro-esophageal reflux disease without esophagitis: Secondary | ICD-10-CM | POA: Diagnosis not present

## 2017-09-27 DIAGNOSIS — M129 Arthropathy, unspecified: Secondary | ICD-10-CM | POA: Diagnosis not present

## 2017-09-27 DIAGNOSIS — F419 Anxiety disorder, unspecified: Secondary | ICD-10-CM | POA: Diagnosis not present

## 2017-09-27 DIAGNOSIS — E782 Mixed hyperlipidemia: Secondary | ICD-10-CM | POA: Diagnosis not present

## 2017-10-05 DIAGNOSIS — M654 Radial styloid tenosynovitis [de Quervain]: Secondary | ICD-10-CM | POA: Diagnosis not present

## 2017-10-26 DIAGNOSIS — M654 Radial styloid tenosynovitis [de Quervain]: Secondary | ICD-10-CM | POA: Diagnosis not present

## 2017-10-26 DIAGNOSIS — M65832 Other synovitis and tenosynovitis, left forearm: Secondary | ICD-10-CM | POA: Diagnosis not present

## 2017-11-09 DIAGNOSIS — Z5189 Encounter for other specified aftercare: Secondary | ICD-10-CM | POA: Insufficient documentation

## 2017-11-15 DIAGNOSIS — Z79899 Other long term (current) drug therapy: Secondary | ICD-10-CM | POA: Diagnosis not present

## 2017-11-15 DIAGNOSIS — Z9889 Other specified postprocedural states: Secondary | ICD-10-CM | POA: Diagnosis not present

## 2017-11-15 DIAGNOSIS — R110 Nausea: Secondary | ICD-10-CM | POA: Diagnosis not present

## 2017-11-15 DIAGNOSIS — R5383 Other fatigue: Secondary | ICD-10-CM | POA: Diagnosis not present

## 2018-01-19 DIAGNOSIS — Z23 Encounter for immunization: Secondary | ICD-10-CM | POA: Diagnosis not present

## 2018-01-23 DIAGNOSIS — M8589 Other specified disorders of bone density and structure, multiple sites: Secondary | ICD-10-CM | POA: Diagnosis not present

## 2018-01-23 DIAGNOSIS — Z803 Family history of malignant neoplasm of breast: Secondary | ICD-10-CM | POA: Diagnosis not present

## 2018-01-23 DIAGNOSIS — Z1231 Encounter for screening mammogram for malignant neoplasm of breast: Secondary | ICD-10-CM | POA: Diagnosis not present

## 2018-04-05 DIAGNOSIS — Z0000 Encounter for general adult medical examination without abnormal findings: Secondary | ICD-10-CM | POA: Diagnosis not present

## 2018-04-05 DIAGNOSIS — F325 Major depressive disorder, single episode, in full remission: Secondary | ICD-10-CM | POA: Diagnosis not present

## 2018-04-05 DIAGNOSIS — K219 Gastro-esophageal reflux disease without esophagitis: Secondary | ICD-10-CM | POA: Diagnosis not present

## 2018-04-05 DIAGNOSIS — E782 Mixed hyperlipidemia: Secondary | ICD-10-CM | POA: Diagnosis not present

## 2018-04-05 DIAGNOSIS — M129 Arthropathy, unspecified: Secondary | ICD-10-CM | POA: Diagnosis not present

## 2018-04-05 DIAGNOSIS — Z1389 Encounter for screening for other disorder: Secondary | ICD-10-CM | POA: Diagnosis not present

## 2018-04-23 DIAGNOSIS — M65312 Trigger thumb, left thumb: Secondary | ICD-10-CM | POA: Diagnosis not present

## 2018-04-26 DIAGNOSIS — H2513 Age-related nuclear cataract, bilateral: Secondary | ICD-10-CM | POA: Diagnosis not present

## 2018-04-26 DIAGNOSIS — H04123 Dry eye syndrome of bilateral lacrimal glands: Secondary | ICD-10-CM | POA: Diagnosis not present

## 2018-04-26 DIAGNOSIS — H35033 Hypertensive retinopathy, bilateral: Secondary | ICD-10-CM | POA: Diagnosis not present

## 2018-04-26 DIAGNOSIS — H25013 Cortical age-related cataract, bilateral: Secondary | ICD-10-CM | POA: Diagnosis not present

## 2018-04-26 DIAGNOSIS — H43811 Vitreous degeneration, right eye: Secondary | ICD-10-CM | POA: Diagnosis not present

## 2018-05-30 DIAGNOSIS — M549 Dorsalgia, unspecified: Secondary | ICD-10-CM | POA: Diagnosis not present

## 2018-06-14 DIAGNOSIS — M1712 Unilateral primary osteoarthritis, left knee: Secondary | ICD-10-CM | POA: Diagnosis not present

## 2018-07-03 DIAGNOSIS — M129 Arthropathy, unspecified: Secondary | ICD-10-CM | POA: Diagnosis not present

## 2018-07-03 DIAGNOSIS — Z01818 Encounter for other preprocedural examination: Secondary | ICD-10-CM | POA: Diagnosis not present

## 2018-07-05 DIAGNOSIS — M1712 Unilateral primary osteoarthritis, left knee: Secondary | ICD-10-CM | POA: Diagnosis not present

## 2018-07-10 NOTE — Patient Instructions (Signed)
[name]  07/10/2018   Your procedure is scheduled on: 07-10-18   Report to [facility] Main  Entrance               Report to admitting at       0600 AM    Call this number if you have problems the morning of surgery [phone]    Remember: Do not eat food or drink liquids :After Midnight. BRUSH YOUR TEETH MORNING OF SURGERY AND RINSE YOUR             MOUTH OUT, NO CHEWING GUM CANDY OR MINTS.     Take these medicines the morning of surgery with A SIP OF WATER: zoloft, crestor, protonix                                You may not have any metal on your body including hair pins and              piercings  Do not wear jewelry, make-up, lotions, powders or perfumes, deodorant             Do not wear nail polish.  Do not shave  48 hours prior to surgery.     Do not bring valuables to the hospital. [place].  Contacts, dentures or bridgework may not be worn into surgery.  Leave suitcase in the car. After surgery it may be brought to your room.                  Please read over the following fact sheets you were given: _____________________________________________________________________           [facility] - Preparing for Surgery Before surgery, you can play an important role.  Because skin is not sterile, your skin needs to be as free of germs as possible.  You can reduce the number of germs on your skin by washing with CHG (chlorahexidine gluconate) soap before surgery.  CHG is an antiseptic cleaner which kills germs and bonds with the skin to continue killing germs even after washing. Please DO NOT use if you have an allergy to CHG or antibacterial soaps.  If your skin becomes reddened/irritated stop using the CHG and inform your nurse when you arrive at Short Stay. Do not shave (including legs and underarms) for at least 48 hours prior to the first CHG shower.  You may shave your face/neck. Please  follow these instructions carefully:  1.  Shower with CHG Soap the night before surgery and the  morning of Surgery.  2.  If you choose to wash your hair, wash your hair first as usual with your  normal  shampoo.  3.  After you shampoo, rinse your hair and body thoroughly to remove the  shampoo.                           4.  Use CHG as you would any other liquid soap.  You can apply chg directly  to the skin and wash                       Gently with a scrungie or clean  washcloth.  5.  Apply the CHG Soap to your body ONLY FROM THE NECK DOWN.   Do not use on face/ open                           Wound or open sores. Avoid contact with eyes, ears mouth and genitals (private parts).                       Wash face,  Genitals (private parts) with your normal soap.             6.  Wash thoroughly, paying special attention to the area where your surgery  will be performed.  7.  Thoroughly rinse your body with warm water from the neck down.  8.  DO NOT shower/wash with your normal soap after using and rinsing off  the CHG Soap.                9.  Pat yourself dry with a clean towel.            10.  Wear clean pajamas.            11.  Place clean sheets on your bed the night of your first shower and do not  sleep with pets. Day of Surgery : Do not apply any lotions/deodorants the morning of surgery.  Please wear clean clothes to the hospital/surgery center.  FAILURE TO FOLLOW THESE INSTRUCTIONS MAY RESULT IN THE CANCELLATION OF YOUR SURGERY PATIENT SIGNATURE_________________________________  NURSE SIGNATURE__________________________________  ________________________________________________________________________  WHAT IS A BLOOD TRANSFUSION? Blood Transfusion Information  A transfusion is the replacement of blood or some of its parts. Blood is made up of multiple cells which provide different functions.  Red blood cells carry oxygen and are used for blood loss replacement.  White blood cells  fight against infection.  Platelets control bleeding.  Plasma helps clot blood.  Other blood products are available for specialized needs, such as hemophilia or other clotting disorders. BEFORE THE TRANSFUSION  Who gives blood for transfusions?   Healthy volunteers who are fully evaluated to make sure their blood is safe. This is blood bank blood. Transfusion therapy is the safest it has ever been in the practice of medicine. Before blood is taken from a donor, a complete history is taken to make sure that person has no history of diseases nor engages in risky social behavior (examples are intravenous drug use or sexual activity with multiple partners). The donor's travel history is screened to minimize risk of transmitting infections, such as malaria. The donated blood is tested for signs of infectious diseases, such as HIV and hepatitis. The blood is then tested to be sure it is compatible with you in order to minimize the chance of a transfusion reaction. If you or a relative donates blood, this is often done in anticipation of surgery and is not appropriate for emergency situations. It takes many days to process the donated blood. RISKS AND COMPLICATIONS Although transfusion therapy is very safe and saves many lives, the main dangers of transfusion include:   Getting an infectious disease.  Developing a transfusion reaction. This is an allergic reaction to something in the blood you were given. Every precaution is taken to prevent this. The decision to have a blood transfusion has been considered carefully by your caregiver before blood is given. Blood is not given unless the benefits outweigh the risks. AFTER THE TRANSFUSION  Right after receiving a blood transfusion, you will usually feel much better and more energetic. This is especially true if your red blood cells have gotten low (anemic). The transfusion raises the level of the red blood cells which carry oxygen, and this usually  causes an energy increase.  The nurse administering the transfusion will monitor you carefully for complications. HOME CARE INSTRUCTIONS  No special instructions are needed after a transfusion. You may find your energy is better. Speak with your caregiver about any limitations on activity for underlying diseases you may have. SEEK MEDICAL CARE IF:   Your condition is not improving after your transfusion.  You develop redness or irritation at the intravenous (IV) site. SEEK IMMEDIATE MEDICAL CARE IF:  Any of the following symptoms occur over the next 12 hours:  Shaking chills.  You have a temperature by mouth above 102 F (38.9 C), not controlled by medicine.  Chest, back, or muscle pain.  People around you feel you are not acting correctly or are confused.  Shortness of breath or difficulty breathing.  Dizziness and fainting.  You get a rash or develop hives.  You have a decrease in urine output.  Your urine turns a dark color or changes to pink, red, or brown. Any of the following symptoms occur over the next 10 days:  You have a temperature by mouth above 102 F (38.9 C), not controlled by medicine.  Shortness of breath.  Weakness after normal activity.  The white part of the eye turns yellow (jaundice).  You have a decrease in the amount of urine or are urinating less often.  Your urine turns a dark color or changes to pink, red, or brown. Document Released: 04/15/2000 Document Revised: 07/11/2011 Document Reviewed: 12/03/2007 ExitCare Patient Information 2014 [place].  _______________________________________________________________________  Incentive Spirometer  An incentive spirometer is a tool that can help keep your lungs clear and active. This tool measures how well you are filling your lungs with each breath. Taking long deep breaths may help reverse or decrease the chance of developing breathing (pulmonary) problems (especially infection)  following:  A long period of time when you are unable to move or be active. BEFORE THE PROCEDURE   If the spirometer includes an indicator to show your best effort, your nurse or respiratory therapist will set it to a desired goal.  If possible, sit up straight or lean slightly forward. Try not to slouch.  Hold the incentive spirometer in an upright position. INSTRUCTIONS FOR USE  1. Sit on the edge of your bed if possible, or sit up as far as you can in bed or on a chair. 2. Hold the incentive spirometer in an upright position. 3. Breathe out normally. 4. Place the mouthpiece in your mouth and seal your lips tightly around it. 5. Breathe in slowly and as deeply as possible, raising the piston or the ball toward the top of the column. 6. Hold your breath for 3-5 seconds or for as long as possible. Allow the piston or ball to fall to the bottom of the column. 7. Remove the mouthpiece from your mouth and breathe out normally. 8. Rest for a few seconds and repeat Steps 1 through 7 at least 10 times every 1-2 hours when you are awake. Take your time and take a few normal breaths between deep breaths. 9. The spirometer may include an indicator to show your best effort. Use the indicator as a goal to work toward during each repetition. 10. After  each set of 10 deep breaths, practice coughing to be sure your lungs are clear. If you have an incision (the cut made at the time of surgery), support your incision when coughing by placing a pillow or rolled up towels firmly against it. Once you are able to get out of bed, walk around indoors and cough well. You may stop using the incentive spirometer when instructed by your caregiver.  RISKS AND COMPLICATIONS  Take your time so you do not get dizzy or light-headed.  If you are in pain, you may need to take or ask for pain medication before doing incentive spirometry. It is harder to take a deep breath if you are having pain. AFTER USE  Rest and  breathe slowly and easily.  It can be helpful to keep track of a log of your progress. Your caregiver can provide you with a simple table to help with this. If you are using the spirometer at home, follow these instructions: [place] IF:   You are having difficultly using the spirometer.  You have trouble using the spirometer as often as instructed.  Your pain medication is not giving enough relief while using the spirometer.  You develop fever of 100.5 F (38.1 C) or higher. SEEK IMMEDIATE MEDICAL CARE IF:   You cough up bloody sputum that had not been present before.  You develop fever of 102 F (38.9 C) or greater.  You develop worsening pain at or near the incision site. MAKE SURE YOU:   Understand these instructions.  Will watch your condition.  Will get help right away if you are not doing well or get worse. Document Released: 08/29/2006 Document Revised: 07/11/2011 Document Reviewed: 10/30/2006 [facility] Patient Information 2014 [place], [place].   ________________________________________________________________________

## 2018-07-11 ENCOUNTER — Encounter (HOSPITAL_COMMUNITY)
Admission: RE | Admit: 2018-07-11 | Discharge: 2018-07-11 | Disposition: A | Discharge status: Home / Self Care | Payer: Medicare Other | Source: Ambulatory Visit | Attending: Orthopedic Surgery | Admitting: Orthopedic Surgery

## 2018-07-11 ENCOUNTER — Encounter (HOSPITAL_COMMUNITY): Payer: Self-pay

## 2018-07-11 ENCOUNTER — Other Ambulatory Visit: Payer: Self-pay

## 2018-07-11 DIAGNOSIS — Z01812 Encounter for preprocedural laboratory examination: Secondary | ICD-10-CM | POA: Insufficient documentation

## 2018-07-11 HISTORY — DX: Pneumonia, unspecified organism: J18.9

## 2018-07-11 LAB — URINALYSIS, ROUTINE W REFLEX MICROSCOPIC
Bilirubin Urine: NEGATIVE
Glucose, UA: NEGATIVE mg/dL
Hgb urine dipstick: NEGATIVE
Ketones, ur: NEGATIVE mg/dL
Nitrite: NEGATIVE
Protein, ur: NEGATIVE mg/dL
Specific Gravity, Urine: 1.02 (ref 1.005–1.030)
pH: 5 (ref 5.0–8.0)

## 2018-07-11 LAB — CBC
HCT: 41.5 % (ref 36.0–46.0)
Hemoglobin: 13 g/dL (ref 12.0–15.0)
MCH: 28.4 pg (ref 26.0–34.0)
MCHC: 31.3 g/dL (ref 30.0–36.0)
MCV: 90.8 fL (ref 80.0–100.0)
Platelets: 211 10*3/uL (ref 150–400)
RBC: 4.57 MIL/uL (ref 3.87–5.11)
RDW: 13.1 % (ref 11.5–15.5)
WBC: 4.4 10*3/uL (ref 4.0–10.5)
nRBC: 0 % (ref 0.0–0.2)

## 2018-07-11 LAB — COMPREHENSIVE METABOLIC PANEL
ALT: 18 U/L (ref 0–44)
AST: 20 U/L (ref 15–41)
Albumin: 4.4 g/dL (ref 3.5–5.0)
Alkaline Phosphatase: 77 U/L (ref 38–126)
Anion gap: 7 (ref 5–15)
BUN: 21 mg/dL (ref 8–23)
CO2: 28 mmol/L (ref 22–32)
Calcium: 9 mg/dL (ref 8.9–10.3)
Chloride: 107 mmol/L (ref 98–111)
Creatinine, Ser: 0.8 mg/dL (ref 0.44–1.00)
GFR calc Af Amer: >60 mL/min (ref >60)
GFR calc non Af Amer: >60 mL/min (ref >60)
Glucose, Bld: 89 mg/dL (ref 70–99)
Potassium: 4.4 mmol/L (ref 3.5–5.1)
Sodium: 142 mmol/L (ref 135–145)
Total Bilirubin: 0.8 mg/dL (ref 0.3–1.2)
Total Protein: 7.3 g/dL (ref 6.5–8.1)

## 2018-07-11 LAB — SURGICAL PCR SCREEN
MRSA, PCR: NEGATIVE
Staphylococcus aureus: NEGATIVE

## 2018-07-11 LAB — PROTIME-INR
INR: 0.9 (ref 0.8–1.2)
Prothrombin Time: 12.1 seconds (ref 11.4–15.2)

## 2018-07-11 LAB — APTT: aPTT: 28 seconds (ref 24–36)

## 2018-07-11 LAB — ABO/RH: ABO/RH(D): B POS

## 2018-07-11 NOTE — Progress Notes (Signed)
MBB:[id number] [name]  CARDIOLOGIST:none  INFO IN Epic:UA abnormal  INFO ON CHART:  BLOOD THINNERS AND LAST DOSES:n/a ____________________________________  PATIENT SYMPTOMS AT TIME OF PREOP:none

## 2018-07-11 NOTE — Progress Notes (Signed)
Clearance [name] 07-20-18 on chart

## 2018-07-16 NOTE — H&P (Signed)
TOTAL KNEE ADMISSION H&P  Patient is being admitted for left total knee arthroplasty.  Subjective:  Chief Complaint:left knee pain.  HPI: [name], 68 y.o. female, has a history of pain and functional disability in the left knee due to arthritis and has failed non-surgical conservative treatments for greater than 12 weeks to includeNSAID's and/or analgesics, corticosteriod injections, viscosupplementation injections, flexibility and strengthening excercises and activity modification.  Onset of symptoms was gradual, starting 5 years ago with gradually worsening course since that time. The patient noted no past surgery on the left knee(s).  Patient currently rates pain in the left knee(s) at 8 out of 10 with activity. Patient has night pain, worsening of pain with activity and weight bearing, pain that interferes with activities of daily living, pain with passive range of motion, crepitus and joint swelling.  Patient has evidence of periarticular osteophytes and joint space narrowing by imaging studies. There is no active infection.   Past Medical History:  Diagnosis Date  . Anxiety   . Arthritis   . GERD (gastroesophageal reflux disease)   . Pneumonia     Past Surgical History:  Procedure Laterality Date  . CESAREAN SECTION     x2   . FINGER SURGERY     rt little finger trigger   . FOOT SURGERY     right   foot  tendon  . PLANTAR FASCIA RELEASE     left  . SHOULDER ARTHROSCOPY WITH SUBACROMIAL DECOMPRESSION AND BICEP TENDON REPAIR Left 06/12/2015   Procedure: LEFT SHOULDER ARTHROSCOPY WITH DEBRIDEMENT OF SLAP, SUBACROMIAL DECOMPRESSION AND BICEP TENODESIS;  Surgeon: Netta Cedars, MD;  Location: [place];  Service: Orthopedics;  Laterality: Left;    Current Outpatient Medications  Medication Sig Dispense Refill Last Dose  . ibuprofen (ADVIL,MOTRIN) 200 MG tablet Take 400 mg by mouth every 6 (six) hours as needed for moderate pain.     . Multiple Vitamins-Minerals (MULTIVITAMIN WITH  MINERALS) tablet Take 1 tablet by mouth daily.   Past Week at Unknown time  . pantoprazole (PROTONIX) 40 MG tablet Take 40 mg by mouth daily.    Past Week at Unknown time  . Probiotic Product (PROBIOTIC DAILY PO) Take 1 capsule by mouth daily.     . rosuvastatin (CRESTOR) 20 MG tablet Take 20 mg by mouth daily.   Past Week at Unknown time  . sertraline (ZOLOFT) 100 MG tablet Take 100 mg by mouth daily.   Past Week at Unknown time  . methocarbamol (ROBAXIN) 500 MG tablet Take 1 tablet (500 mg total) by mouth 3 (three) times daily as needed. (Patient not taking: Reported on 07/05/2018) 60 tablet 1 Not Taking at Unknown time  . oxyCODONE-acetaminophen (ROXICET) 5-325 MG tablet Take 1-2 tablets by mouth every 4 (four) hours as needed for severe pain. (Patient not taking: Reported on 07/05/2018) 60 tablet 0 Not Taking at Unknown time   No Known Allergies  Social History   Tobacco Use  . Smoking status: Never Smoker  . Smokeless tobacco: Never Used  Substance Use Topics  . Alcohol use: No      Review of Systems  Constitutional: Negative.   HENT: Negative.   Eyes: Negative.   Respiratory: Negative.   Cardiovascular: Negative.   Gastrointestinal: Positive for heartburn. Negative for abdominal pain, blood in stool, constipation, diarrhea, melena, nausea and vomiting.  Genitourinary: Positive for frequency. Negative for dysuria, flank pain, hematuria and urgency.  Musculoskeletal: Positive for back pain, joint pain and myalgias. Negative for falls  and neck pain.  Skin: Negative.   Neurological: Negative.   Endo/Heme/Allergies: Negative.   Psychiatric/Behavioral: Negative.     Objective:  Physical Exam  Constitutional: She is oriented to person, place, and time. She appears well-developed. No distress.  Overweight  HENT:  Head: Normocephalic and atraumatic.  Right Ear: External ear normal.  Left Ear: External ear normal.  Nose: Nose normal.  Mouth/Throat: Oropharynx is clear and moist.   Eyes: Conjunctivae and EOM are normal.  Neck: Normal range of motion. Neck supple.  Cardiovascular: Normal rate, regular rhythm, normal heart sounds and intact distal pulses.  No murmur heard. Respiratory: Effort normal and breath sounds normal. No respiratory distress. She has no wheezes.  GI: Soft. There is no abdominal tenderness.  Musculoskeletal:     Right hip: Normal.     Left hip: Normal.     Right knee: Normal.     Left knee: She exhibits decreased range of motion and swelling. She exhibits no effusion and no erythema. Tenderness found. Medial joint line and lateral joint line tenderness noted.  Neurological: She is alert and oriented to person, place, and time. She has normal strength. No sensory deficit.  Skin: No rash noted. She is not diaphoretic. No erythema.  Psychiatric: She has a normal mood and affect. Her behavior is normal.    Vitals Ht: 5 ft 6 in  Wt: 191.4 lbs  BMI: 30.9  BP: 130/72  Pulse: 72 bpm   Imaging Review Plain radiographs demonstrate severe degenerative joint disease of the left knee(s). The overall alignment ismild varus. The bone quality appears to be good for age and reported activity level.   Assessment/Plan:  End stage primary osteoarthritis, left knee   The patient history, physical examination, clinical judgment of the provider and imaging studies are consistent with end stage degenerative joint disease of the left knee(s) and total knee arthroplasty is deemed medically necessary. The treatment options including medical management, injection therapy arthroscopy and arthroplasty were discussed at length. The risks and benefits of total knee arthroplasty were presented and reviewed. The risks due to aseptic loosening, infection, stiffness, patella tracking problems, thromboembolic complications and other imponderables were discussed. The patient acknowledged the explanation, agreed to proceed with the plan and consent was signed. Patient is being  admitted for inpatient treatment for surgery, pain control, PT, OT, prophylactic antibiotics, VTE prophylaxis, progressive ambulation and ADL's and discharge planning. The patient is planning to be discharged home with outpatient therapy.    Anticipated LOS equal to or greater than 2 midnights due to - Age 10 and older with one or more of the following:  - Obesity  - Expected need for hospital services (PT, OT, Nursing) required for safe  discharge  - Anticipated need for postoperative skilled nursing care or inpatient rehab  - Active co-morbidities: None OR   - Unanticipated findings during/Post Surgery: None  - Patient is a high risk of re-admission due to: None    Therapy Plans: outpatient therapy at [facility] starting 3/23 Disposition: Home with husband Planned DVT prophylaxis: aspirin 325mg  BID DME needed: walker PCP: Carol Ada  Other: no anesthesia concerns Wants general anesthesia  Ardeen Jourdain, PA-C

## 2018-07-17 MED ORDER — BUPIVACAINE LIPOSOME 1.3 % IJ SUSP
20.0000 mL | Freq: Once | INTRAMUSCULAR | Status: DC
  Filled 2018-07-17: qty 20

## 2018-07-17 NOTE — Progress Notes (Signed)
Patient denies any new symptoms of COVID 19. She is instructed that she can only bring only one family member. She verbalizes understanding.

## 2018-07-18 ENCOUNTER — Inpatient Hospital Stay (HOSPITAL_COMMUNITY)
Admission: RE | Admit: 2018-07-18 | Discharge: 2018-07-20 | DRG: 470 | Disposition: A | Discharge status: Home / Self Care | Payer: Medicare Other | Attending: Orthopedic Surgery | Admitting: Orthopedic Surgery

## 2018-07-18 ENCOUNTER — Encounter (HOSPITAL_COMMUNITY): Payer: Self-pay

## 2018-07-18 ENCOUNTER — Encounter (HOSPITAL_COMMUNITY)
Admission: RE | Disposition: A | Discharge status: Home / Self Care | Payer: Self-pay | Source: Home / Self Care | Attending: Orthopedic Surgery

## 2018-07-18 ENCOUNTER — Inpatient Hospital Stay (HOSPITAL_COMMUNITY): Payer: Medicare Other | Admitting: Physician Assistant

## 2018-07-18 ENCOUNTER — Inpatient Hospital Stay (HOSPITAL_COMMUNITY): Payer: Medicare Other | Admitting: Certified Registered"

## 2018-07-18 ENCOUNTER — Other Ambulatory Visit: Payer: Self-pay

## 2018-07-18 DIAGNOSIS — M1712 Unilateral primary osteoarthritis, left knee: Secondary | ICD-10-CM | POA: Diagnosis not present

## 2018-07-18 DIAGNOSIS — M24562 Contracture, left knee: Secondary | ICD-10-CM | POA: Diagnosis not present

## 2018-07-18 DIAGNOSIS — K219 Gastro-esophageal reflux disease without esophagitis: Secondary | ICD-10-CM | POA: Diagnosis not present

## 2018-07-18 DIAGNOSIS — F419 Anxiety disorder, unspecified: Secondary | ICD-10-CM | POA: Diagnosis present

## 2018-07-18 DIAGNOSIS — G8918 Other acute postprocedural pain: Secondary | ICD-10-CM | POA: Diagnosis not present

## 2018-07-18 DIAGNOSIS — E669 Obesity, unspecified: Secondary | ICD-10-CM | POA: Diagnosis not present

## 2018-07-18 DIAGNOSIS — Z6829 Body mass index (BMI) 29.0-29.9, adult: Secondary | ICD-10-CM | POA: Diagnosis not present

## 2018-07-18 DIAGNOSIS — Z79899 Other long term (current) drug therapy: Secondary | ICD-10-CM | POA: Diagnosis not present

## 2018-07-18 DIAGNOSIS — M62462 Contracture of muscle, left lower leg: Secondary | ICD-10-CM | POA: Diagnosis not present

## 2018-07-18 DIAGNOSIS — Z96652 Presence of left artificial knee joint: Secondary | ICD-10-CM

## 2018-07-18 HISTORY — PX: TOTAL KNEE ARTHROPLASTY: SHX125

## 2018-07-18 LAB — TYPE AND SCREEN
ABO/RH(D): B POS
Antibody Screen: NEGATIVE

## 2018-07-18 SURGERY — ARTHROPLASTY, KNEE, TOTAL
Anesthesia: Spinal | Laterality: Left

## 2018-07-18 MED ORDER — PROPOFOL 500 MG/50ML IV EMUL
INTRAVENOUS | Status: DC | PRN
  Administered 2018-07-18: 40 ug/kg/min via INTRAVENOUS

## 2018-07-18 MED ORDER — METOCLOPRAMIDE HCL 5 MG PO TABS: 5.0000 mg | ORAL_TABLET | Freq: Three times a day (TID) | ORAL | Status: DC | PRN

## 2018-07-18 MED ORDER — EPHEDRINE SULFATE-NACL 50-0.9 MG/10ML-% IV SOSY
PREFILLED_SYRINGE | INTRAVENOUS | Status: DC | PRN
  Administered 2018-07-18: 5 mg via INTRAVENOUS
  Administered 2018-07-18 (×4): 10 mg via INTRAVENOUS

## 2018-07-18 MED ORDER — CEFAZOLIN SODIUM-DEXTROSE 2-4 GM/100ML-% IV SOLN
2.0000 g | INTRAVENOUS | Status: AC
  Administered 2018-07-18: 2 g via INTRAVENOUS
  Filled 2018-07-18: qty 100

## 2018-07-18 MED ORDER — ONDANSETRON HCL 4 MG PO TABS: 4.0000 mg | ORAL_TABLET | Freq: Four times a day (QID) | ORAL | Status: DC | PRN

## 2018-07-18 MED ORDER — METHOCARBAMOL 500 MG PO TABS
500.0000 mg | ORAL_TABLET | Freq: Four times a day (QID) | ORAL | Status: DC | PRN
  Administered 2018-07-19 – 2018-07-20 (×4): 500 mg via ORAL
  Filled 2018-07-18 (×4): qty 1

## 2018-07-18 MED ORDER — CHLORHEXIDINE GLUCONATE 4 % EX LIQD: 60.0000 mL | Freq: Once | CUTANEOUS | Status: DC

## 2018-07-18 MED ORDER — PROPOFOL 10 MG/ML IV BOLUS
INTRAVENOUS | Status: AC
  Filled 2018-07-18: qty 40

## 2018-07-18 MED ORDER — MEPERIDINE HCL 50 MG/ML IJ SOLN: 6.2500 mg | INTRAMUSCULAR | Status: DC | PRN

## 2018-07-18 MED ORDER — SODIUM CHLORIDE (PF) 0.9 % IJ SOLN
INTRAMUSCULAR | Status: AC
  Filled 2018-07-18: qty 20

## 2018-07-18 MED ORDER — FENTANYL CITRATE (PF) 100 MCG/2ML IJ SOLN
INTRAMUSCULAR | Status: AC
  Filled 2018-07-18: qty 2

## 2018-07-18 MED ORDER — BISACODYL 5 MG PO TBEC: 5.0000 mg | DELAYED_RELEASE_TABLET | Freq: Every day | ORAL | Status: DC | PRN

## 2018-07-18 MED ORDER — MENTHOL 3 MG MT LOZG: 1.0000 | LOZENGE | OROMUCOSAL | Status: DC | PRN

## 2018-07-18 MED ORDER — PROPOFOL 10 MG/ML IV BOLUS
INTRAVENOUS | Status: AC
  Filled 2018-07-18: qty 20

## 2018-07-18 MED ORDER — PHENYLEPHRINE HCL 10 MG/ML IJ SOLN
INTRAMUSCULAR | Status: AC
  Filled 2018-07-18: qty 1

## 2018-07-18 MED ORDER — ONDANSETRON HCL 4 MG/2ML IJ SOLN
INTRAMUSCULAR | Status: DC | PRN
  Administered 2018-07-18: 4 mg via INTRAVENOUS

## 2018-07-18 MED ORDER — MIDAZOLAM HCL 2 MG/2ML IJ SOLN
INTRAMUSCULAR | Status: AC
  Filled 2018-07-18: qty 4

## 2018-07-18 MED ORDER — OXYCODONE HCL 5 MG PO TABS
5.0000 mg | ORAL_TABLET | ORAL | Status: DC | PRN
  Administered 2018-07-19 – 2018-07-20 (×2): 10 mg via ORAL
  Filled 2018-07-18 (×6): qty 2

## 2018-07-18 MED ORDER — ACETAMINOPHEN 500 MG PO TABS
1000.0000 mg | ORAL_TABLET | Freq: Four times a day (QID) | ORAL | Status: AC
  Administered 2018-07-18 – 2018-07-19 (×4): 1000 mg via ORAL
  Filled 2018-07-18 (×4): qty 2

## 2018-07-18 MED ORDER — SODIUM CHLORIDE (PF) 0.9 % IJ SOLN
INTRAMUSCULAR | Status: DC | PRN
  Administered 2018-07-18: 20 mL

## 2018-07-18 MED ORDER — SODIUM CHLORIDE 0.9 % IR SOLN
Status: DC | PRN
  Administered 2018-07-18: 1000 mL

## 2018-07-18 MED ORDER — FERROUS SULFATE 325 (65 FE) MG PO TABS
325.0000 mg | ORAL_TABLET | Freq: Three times a day (TID) | ORAL | Status: DC
  Administered 2018-07-19 – 2018-07-20 (×4): 325 mg via ORAL
  Filled 2018-07-18 (×4): qty 1

## 2018-07-18 MED ORDER — POLYETHYLENE GLYCOL 3350 17 G PO PACK: 17.0000 g | PACK | Freq: Every day | ORAL | Status: DC | PRN

## 2018-07-18 MED ORDER — LACTATED RINGERS IV SOLN
INTRAVENOUS | Status: DC
  Administered 2018-07-18: 08:00:00 via INTRAVENOUS
  Administered 2018-07-18: 1000 mL via INTRAVENOUS

## 2018-07-18 MED ORDER — ONDANSETRON HCL 4 MG/2ML IJ SOLN
4.0000 mg | Freq: Four times a day (QID) | INTRAMUSCULAR | Status: DC | PRN
  Filled 2018-07-18: qty 2

## 2018-07-18 MED ORDER — BUPIVACAINE LIPOSOME 1.3 % IJ SUSP
INTRAMUSCULAR | Status: DC | PRN
  Administered 2018-07-18: 20 mL

## 2018-07-18 MED ORDER — CEFAZOLIN SODIUM-DEXTROSE 1-4 GM/50ML-% IV SOLN
1.0000 g | Freq: Four times a day (QID) | INTRAVENOUS | Status: AC
  Administered 2018-07-18 (×2): 1 g via INTRAVENOUS
  Filled 2018-07-18 (×2): qty 50

## 2018-07-18 MED ORDER — DOCUSATE SODIUM 100 MG PO CAPS
100.0000 mg | ORAL_CAPSULE | Freq: Two times a day (BID) | ORAL | Status: DC
  Administered 2018-07-18 – 2018-07-20 (×4): 100 mg via ORAL
  Filled 2018-07-18 (×5): qty 1

## 2018-07-18 MED ORDER — ONDANSETRON HCL 4 MG/2ML IJ SOLN: 4.0000 mg | Freq: Once | INTRAMUSCULAR | Status: DC | PRN

## 2018-07-18 MED ORDER — BUPIVACAINE-EPINEPHRINE (PF) 0.5% -1:200000 IJ SOLN
INTRAMUSCULAR | Status: DC | PRN
  Administered 2018-07-18: 30 mL via PERINEURAL

## 2018-07-18 MED ORDER — PHENOL 1.4 % MT LIQD: 1.0000 | OROMUCOSAL | Status: DC | PRN

## 2018-07-18 MED ORDER — BUPIVACAINE IN DEXTROSE 0.75-8.25 % IT SOLN
INTRATHECAL | Status: DC | PRN
  Administered 2018-07-18: 1.8 mL via INTRATHECAL

## 2018-07-18 MED ORDER — ALUM & MAG HYDROXIDE-SIMETH 200-200-20 MG/5ML PO SUSP: 30.0000 mL | ORAL | Status: DC | PRN

## 2018-07-18 MED ORDER — METOCLOPRAMIDE HCL 5 MG/ML IJ SOLN: 5.0000 mg | Freq: Three times a day (TID) | INTRAMUSCULAR | Status: DC | PRN

## 2018-07-18 MED ORDER — ACETAMINOPHEN 325 MG PO TABS
325.0000 mg | ORAL_TABLET | Freq: Four times a day (QID) | ORAL | Status: DC | PRN
  Administered 2018-07-19 – 2018-07-20 (×2): 650 mg via ORAL
  Filled 2018-07-18 (×2): qty 2

## 2018-07-18 MED ORDER — HYDROMORPHONE HCL 1 MG/ML IJ SOLN: 0.2500 mg | INTRAMUSCULAR | Status: DC | PRN

## 2018-07-18 MED ORDER — SODIUM CHLORIDE 0.9 % IV SOLN
INTRAVENOUS | Status: AC
  Filled 2018-07-18: qty 500000

## 2018-07-18 MED ORDER — LACTATED RINGERS IV SOLN
INTRAVENOUS | Status: DC
  Administered 2018-07-18: 19:00:00 via INTRAVENOUS

## 2018-07-18 MED ORDER — FLEET ENEMA 7-19 GM/118ML RE ENEM: 1.0000 | ENEMA | Freq: Once | RECTAL | Status: DC | PRN

## 2018-07-18 MED ORDER — ONDANSETRON HCL 4 MG/2ML IJ SOLN
INTRAMUSCULAR | Status: AC
  Filled 2018-07-18: qty 2

## 2018-07-18 MED ORDER — LIDOCAINE 2% (20 MG/ML) 5 ML SYRINGE
INTRAMUSCULAR | Status: AC
  Filled 2018-07-18: qty 5

## 2018-07-18 MED ORDER — TRANEXAMIC ACID-NACL 1000-0.7 MG/100ML-% IV SOLN
1000.0000 mg | INTRAVENOUS | Status: AC
  Administered 2018-07-18: 1000 mg via INTRAVENOUS
  Filled 2018-07-18: qty 100

## 2018-07-18 MED ORDER — RIVAROXABAN 10 MG PO TABS
10.0000 mg | ORAL_TABLET | Freq: Every day | ORAL | Status: DC
  Administered 2018-07-19 – 2018-07-20 (×2): 10 mg via ORAL
  Filled 2018-07-18 (×2): qty 1

## 2018-07-18 MED ORDER — FENTANYL CITRATE (PF) 100 MCG/2ML IJ SOLN
INTRAMUSCULAR | Status: DC | PRN
  Administered 2018-07-18 (×2): 50 ug via INTRAVENOUS

## 2018-07-18 MED ORDER — MIDAZOLAM HCL 2 MG/2ML IJ SOLN
INTRAMUSCULAR | Status: DC | PRN
  Administered 2018-07-18 (×2): 1 mg via INTRAVENOUS

## 2018-07-18 MED ORDER — OXYCODONE HCL 5 MG PO TABS
10.0000 mg | ORAL_TABLET | ORAL | Status: DC | PRN
  Administered 2018-07-18: 15 mg via ORAL
  Administered 2018-07-18 – 2018-07-19 (×3): 10 mg via ORAL
  Administered 2018-07-19: 15 mg via ORAL
  Filled 2018-07-18 (×2): qty 3

## 2018-07-18 MED ORDER — PHENYLEPHRINE 40 MCG/ML (10ML) SYRINGE FOR IV PUSH (FOR BLOOD PRESSURE SUPPORT)
PREFILLED_SYRINGE | INTRAVENOUS | Status: DC | PRN
  Administered 2018-07-18 (×2): 80 ug via INTRAVENOUS
  Administered 2018-07-18: 40 ug via INTRAVENOUS
  Administered 2018-07-18: 80 ug via INTRAVENOUS
  Administered 2018-07-18 (×2): 40 ug via INTRAVENOUS
  Administered 2018-07-18 (×2): 80 ug via INTRAVENOUS

## 2018-07-18 MED ORDER — HYDROMORPHONE HCL 1 MG/ML IJ SOLN
0.5000 mg | INTRAMUSCULAR | Status: DC | PRN
  Administered 2018-07-18: 1 mg via INTRAVENOUS
  Filled 2018-07-18 (×2): qty 1

## 2018-07-18 MED ORDER — PROPOFOL 10 MG/ML IV BOLUS
INTRAVENOUS | Status: DC | PRN
  Administered 2018-07-18: 20 mg via INTRAVENOUS

## 2018-07-18 MED ORDER — METHOCARBAMOL 500 MG IVPB - SIMPLE MED
500.0000 mg | Freq: Four times a day (QID) | INTRAVENOUS | Status: DC | PRN
  Administered 2018-07-18: 500 mg via INTRAVENOUS
  Filled 2018-07-18: qty 50
  Filled 2018-07-18: qty 500

## 2018-07-18 MED ORDER — DEXAMETHASONE SODIUM PHOSPHATE 10 MG/ML IJ SOLN
INTRAMUSCULAR | Status: DC | PRN
  Administered 2018-07-18: 4 mg via INTRAVENOUS

## 2018-07-18 MED ORDER — DEXAMETHASONE SODIUM PHOSPHATE 10 MG/ML IJ SOLN
INTRAMUSCULAR | Status: AC
  Filled 2018-07-18: qty 1

## 2018-07-18 MED ORDER — SODIUM CHLORIDE 0.9 % IV SOLN
INTRAVENOUS | Status: DC | PRN
  Administered 2018-07-18: 500 mL

## 2018-07-18 SURGICAL SUPPLY — 76 items
AGENT HMST SPONGE THK3/8 (HEMOSTASIS) ×1
APL PRP STRL LF DISP 70% ISPRP (MISCELLANEOUS) ×1
BAG DECANTER FOR FLEXI CONT (MISCELLANEOUS) ×3 IMPLANT
BAG SPEC THK2 15X12 ZIP CLS (MISCELLANEOUS)
BAG ZIPLOCK 12X15 (MISCELLANEOUS) IMPLANT
BANDAGE ACE 4X5 VEL STRL LF (GAUZE/BANDAGES/DRESSINGS) ×3 IMPLANT
BANDAGE ACE 6X5 VEL STRL LF (GAUZE/BANDAGES/DRESSINGS) ×3 IMPLANT
BLADE SAG 18X100X1.27 (BLADE) ×3 IMPLANT
BLADE SAW SGTL 11.0X1.19X90.0M (BLADE) ×3 IMPLANT
BLADE SURG SZ10 CARB STEEL (BLADE) ×6 IMPLANT
BNDG CMPR MED 15X6 ELC VLCR LF (GAUZE/BANDAGES/DRESSINGS) ×1
BNDG ELASTIC 6X15 VLCR STRL LF (GAUZE/BANDAGES/DRESSINGS) ×2 IMPLANT
BNDG GAUZE ELAST 4 BULKY (GAUZE/BANDAGES/DRESSINGS) ×3 IMPLANT
CEMENT HV SMART SET (Cement) ×6 IMPLANT
CEMENT TIBIA MBT (Knees) IMPLANT
CHLORAPREP W/TINT 26 (MISCELLANEOUS) ×3 IMPLANT
CLOSURE WOUND 1/2 X4 (GAUZE/BANDAGES/DRESSINGS) ×2
COVER SURGICAL LIGHT HANDLE (MISCELLANEOUS) ×3 IMPLANT
COVER WAND RF STERILE (DRAPES) IMPLANT
CUFF TOURN SGL QUICK 34 (TOURNIQUET CUFF) ×3
CUFF TRNQT CYL 34X4.125X (TOURNIQUET CUFF) ×1 IMPLANT
DECANTER SPIKE VIAL GLASS SM (MISCELLANEOUS) ×3 IMPLANT
DRAPE INCISE IOBAN 66X45 STRL (DRAPES) IMPLANT
DRAPE U-SHAPE 47X51 STRL (DRAPES) ×3 IMPLANT
DRSG ADAPTIC 3X8 NADH LF (GAUZE/BANDAGES/DRESSINGS) ×3 IMPLANT
DRSG PAD ABDOMINAL 8X10 ST (GAUZE/BANDAGES/DRESSINGS) ×3 IMPLANT
ELECT BLADE TIP CTD 4 INCH (ELECTRODE) ×3 IMPLANT
ELECT REM PT RETURN 15FT ADLT (MISCELLANEOUS) ×3 IMPLANT
EVACUATOR 1/8 PVC DRAIN (DRAIN) ×3 IMPLANT
FACESHIELD WRAPAROUND (MASK) ×3 IMPLANT
GAUZE SPONGE 4X4 12PLY STRL (GAUZE/BANDAGES/DRESSINGS) ×3 IMPLANT
GLOVE BIOGEL PI IND STRL 6.5 (GLOVE) ×1 IMPLANT
GLOVE BIOGEL PI IND STRL 8.5 (GLOVE) ×1 IMPLANT
GLOVE BIOGEL PI INDICATOR 6.5 (GLOVE) ×2
GLOVE BIOGEL PI INDICATOR 8.5 (GLOVE) ×2
GLOVE ECLIPSE 8.0 STRL XLNG CF (GLOVE) ×6 IMPLANT
GLOVE SURG SS PI 6.5 STRL IVOR (GLOVE) ×3 IMPLANT
GOWN STRL REUS W/TWL LRG LVL3 (GOWN DISPOSABLE) ×3 IMPLANT
GOWN STRL REUS W/TWL XL LVL3 (GOWN DISPOSABLE) ×3 IMPLANT
HANDPIECE INTERPULSE COAX TIP (DISPOSABLE) ×3
HEMOSTAT SPONGE AVITENE ULTRA (HEMOSTASIS) ×3 IMPLANT
HOLDER FOLEY CATH W/STRAP (MISCELLANEOUS) IMPLANT
IMMOBILIZER KNEE 20 (SOFTGOODS) ×5 IMPLANT
IMMOBILIZER KNEE 20 THIGH 36 (SOFTGOODS) ×1 IMPLANT
IMPL FEMUR SIGMA LT PS SZ 3 (Knees) IMPLANT
IMPLANT FEMUR SIGMA LT PS SZ 3 (Knees) ×3 IMPLANT
INSERT TIBIAL PFC 3 17.5 (Knees) ×3 IMPLANT
KIT TURNOVER KIT A (KITS) IMPLANT
MANIFOLD NEPTUNE II (INSTRUMENTS) ×3 IMPLANT
NS IRRIG 1000ML POUR BTL (IV SOLUTION) IMPLANT
PACK TOTAL KNEE CUSTOM (KITS) ×3 IMPLANT
PADDING CAST COTTON 6X4 STRL (CAST SUPPLIES) ×3 IMPLANT
PATELLA DOME PFC 38MM (Knees) ×2 IMPLANT
PENCIL SMOKE EVAC W/HOLSTER (ELECTROSURGICAL) ×3 IMPLANT
PIN STEINMAN FIXATION KNEE (PIN) ×2 IMPLANT
PROTECTOR NERVE ULNAR (MISCELLANEOUS) ×3 IMPLANT
SET HNDPC FAN SPRY TIP SCT (DISPOSABLE) ×1 IMPLANT
SET PAD KNEE POSITIONER (MISCELLANEOUS) ×3 IMPLANT
SPONGE LAP 18X18 RF (DISPOSABLE) IMPLANT
STRIP CLOSURE SKIN 1/2X4 (GAUZE/BANDAGES/DRESSINGS) ×4 IMPLANT
SUT BONE WAX W31G (SUTURE) IMPLANT
SUT MNCRL AB 4-0 PS2 18 (SUTURE) ×3 IMPLANT
SUT STRATAFIX 0 PDS 27 VIOLET (SUTURE) ×3
SUT VIC AB 1 CT1 27 (SUTURE) ×3
SUT VIC AB 1 CT1 27XBRD ANTBC (SUTURE) ×1 IMPLANT
SUT VIC AB 2-0 CT1 27 (SUTURE) ×9
SUT VIC AB 2-0 CT1 TAPERPNT 27 (SUTURE) ×3 IMPLANT
SUTURE STRATFX 0 PDS 27 VIOLET (SUTURE) ×1 IMPLANT
SYR 20CC LL (SYRINGE) ×6 IMPLANT
TAPE STRIPS DRAPE STRL (GAUZE/BANDAGES/DRESSINGS) ×3 IMPLANT
TIBIA MBT CEMENT (Knees) ×3 IMPLANT
TOWER CARTRIDGE SMART MIX (DISPOSABLE) ×3 IMPLANT
TRAY FOLEY MTR SLVR 16FR STAT (SET/KITS/TRAYS/PACK) ×3 IMPLANT
WATER STERILE IRR 1000ML POUR (IV SOLUTION) ×3 IMPLANT
WRAP KNEE MAXI GEL POST OP (GAUZE/BANDAGES/DRESSINGS) ×3 IMPLANT
YANKAUER SUCT BULB TIP 10FT TU (MISCELLANEOUS) ×3 IMPLANT

## 2018-07-18 NOTE — Transfer of Care (Signed)
Immediate Anesthesia Transfer of Care Note  Patient: [name]  Procedure(s) Performed: TOTAL KNEE ARTHROPLASTY (Left )  Patient Location: PACU  Anesthesia Type:Spinal  Level of Consciousness: awake, alert  and oriented  Airway & Oxygen Therapy: Patient Spontanous Breathing and Patient connected to face mask oxygen  Post-op Assessment: Report given to RN and Post -op Vital signs reviewed and stable  Post vital signs: Reviewed and stable  Last Vitals:  Vitals Value Taken Time  BP 130/73 07/18/2018 10:46 AM  Temp    Pulse 85 07/18/2018 10:47 AM  Resp 13 07/18/2018 10:47 AM  SpO2 99 % 07/18/2018 10:47 AM  Vitals shown include unvalidated device data.  Last Pain:  Vitals:   07/18/18 0651  TempSrc:   PainSc: 0-No pain      Patients Stated Pain Goal: 3 (48/34/75 8307)  Complications: No apparent anesthesia complications

## 2018-07-18 NOTE — Anesthesia Procedure Notes (Signed)
Date/Time: 07/18/2018 8:30 AM Performed by: Eben Burow, CRNA Pre-anesthesia Checklist: Patient identified, Emergency Drugs available, Suction available, Patient being monitored and Timeout performed Oxygen Delivery Method: Simple face mask Dental Injury: Teeth and Oropharynx as per pre-operative assessment

## 2018-07-18 NOTE — Anesthesia Procedure Notes (Signed)
Anesthesia Regional Block: Adductor canal block   Pre-Anesthetic Checklist: ,, timeout performed, Correct Patient, Correct Site, Correct Laterality, Correct Procedure, Correct Position, site marked, Risks and benefits discussed,  Surgical consent,  Pre-op evaluation,  At surgeon's request and post-op pain management  Laterality: Left  Prep: chloraprep       Needles:  Injection technique: Single-shot  Needle Type: Echogenic Stimulator Needle     Needle Length: 9cm  Needle Gauge: 21   Needle insertion depth: 7 cm   Additional Needles:   Procedures:,,,, ultrasound used (permanent image in chart),,,,  Narrative:  Start time: 07/18/2018 8:12 AM End time: 07/18/2018 8:17 AM Injection made incrementally with aspirations every 5 mL.  Performed by: Personally  Anesthesiologist: Josephine Igo, MD  Additional Notes: Timeout performed. Patient sedated. Relevant anatomy ID'd using [place]. Incremental 2-18ml injection of LA with frequent aspiration. Patient tolerated procedure well.        Left Adductor Canal Block

## 2018-07-18 NOTE — Anesthesia Procedure Notes (Signed)
Spinal  Patient location during procedure: OR Start time: 07/18/2018 8:27 AM End time: 07/18/2018 8:31 AM Staffing Anesthesiologist: Josephine Igo, MD Performed: anesthesiologist  Preanesthetic Checklist Completed: patient identified, site marked, surgical consent, pre-op evaluation, timeout performed, IV checked, risks and benefits discussed and monitors and equipment checked Spinal Block Patient position: sitting Prep: site prepped and draped and DuraPrep Patient monitoring: heart rate, cardiac monitor, continuous pulse ox and blood pressure Approach: midline Location: L4-5 Injection technique: single-shot Needle Needle type: Pencan and Sprotte  Needle gauge: 24 G Needle length: 9 cm Needle insertion depth: 6 cm Assessment Sensory level: T4 Additional Notes Patient tolerated procedure well. Adequate sensory level.

## 2018-07-18 NOTE — Brief Op Note (Signed)
07/18/2018  10:14 AM  PATIENT:  [name]  68 y.o. female  PRE-OPERATIVE DIAGNOSIS:  left knee Primary osteoarthritis with flexion Contractures  POST-OPERATIVE DIAGNOSIS: Same as Pre-Op  PROCEDURE:  Procedure(s) with comments: TOTAL KNEE ARTHROPLASTY (Left) - 167min  SURGEON:  Surgeon(s) and Role:    [name], MD - Primary  PHYSICIAN ASSISTANT: Ardeen Jourdain PA  ASSISTANTS: Ardeen Jourdain PA   ANESTHESIA:   regional and spinal with a Femoral Nerve Block  EBL:  25 mL   BLOOD ADMINISTERED:none  DRAINS: (Open) Hemovact drain(s) in the Left Knee with  Suction Open   LOCAL MEDICATIONS USED:  OTHER Exparel 20cc with 20cc of Normal Saline  SPECIMEN:  No Specimen  DISPOSITION OF SPECIMEN:  N/A  COUNTS:  YES  TOURNIQUET:  * Missing tourniquet times found for documented tourniquets in log: [id number] *  DICTATION: .Other Dictation: Dictation Number [id number]  PLAN OF CARE: Admit to inpatient   PATIENT DISPOSITION:  Stable in OR   Delay start of Pharmacological VTE agent (>24hrs) due to surgical blood loss or risk of bleeding: yes

## 2018-07-18 NOTE — Care Plan (Signed)
Ortho Bundle Case Management Note  Patient Details  Name: [name] MRN: [id number] Date of Birth: 08-Aug-1950  L TKA scheduled 07-18-2018 DCP:  Home with spouse.  1 story home with 1 ste. DME:  RW ordered through [place].  Does not need a 3-in-1.  Has elevated toilets.  PT:  [facility].  PT eval scheduled on 07-23-2018.                   DME Arranged:  [facility]ford Rile rolling DME Agency:  [facility]  HH Arranged:  NA [place] Agency:  NA  Additional Comments: Please contact me with any questions of if this plan should need to change.  Marianne Sofia, RN,CCM [facility]  [phone] 07/18/2018, 3:31 PM

## 2018-07-18 NOTE — Discharge Instructions (Addendum)
° °Dr. Ronald Gioffre °[facility] °[address] °[place], [place] [address] °[phone] ° °TOTAL KNEE REPLACEMENT POSTOPERATIVE DIRECTIONS ° °Knee Rehabilitation, Guidelines Following Surgery  °Results after knee surgery are often greatly improved when you follow the exercise, range of motion and muscle strengthening exercises prescribed by your doctor. Safety measures are also important to protect the knee from further injury. Any time any of these exercises cause you to have increased pain or swelling in your knee joint, decrease the amount until you are comfortable again and slowly increase them. If you have problems or questions, call your caregiver or physical therapist for advice.  ° °HOME CARE INSTRUCTIONS  °Remove items at home which could result in a fall. This includes throw rugs or furniture in walking pathways.  °· ICE to the affected knee every three hours for 30 minutes at a time and then as needed for pain and swelling.  Continue to use ice on the knee for pain and swelling from surgery. You may notice swelling that will progress down to the foot and ankle.  This is normal after surgery.  Elevate the leg when you are not up walking on it.   °· Continue to use the breathing machine which will help keep your temperature down.  It is common for your temperature to cycle up and down following surgery, especially at night when you are not up moving around and exerting yourself.  The breathing machine keeps your lungs expanded and your temperature down. °· Do not place pillow under knee, focus on keeping the knee straight while resting ° °DIET °You may resume your previous home diet once your are discharged from the hospital. ° °DRESSING / WOUND CARE / SHOWERING °You may shower 3 days after surgery, but keep the wounds dry during showering.  You may use an occlusive plastic wrap (Press'n Seal for example), NO SOAKING/SUBMERGING IN THE BATHTUB.  If the bandage gets wet, change with a  clean dry gauze.  If the incision gets wet, pat the wound dry with a clean towel. °You may start showering once you are discharged home but do not submerge the incision under water. Just pat the incision dry and apply a dry gauze dressing on daily. °Change the surgical dressing daily and reapply a dry dressing each time. ° °ACTIVITY °Walk with your walker as instructed. °Use walker as long as suggested by your caregivers. °Avoid periods of inactivity such as sitting longer than an hour when not asleep. This helps prevent blood clots.  °You may resume a sexual relationship in one month or when given the OK by your doctor.  °You may return to work once you are cleared by your doctor.  °Do not drive a car for 6 weeks or until released by you surgeon.  °Do not drive while taking narcotics. ° °WEIGHT BEARING °Weight bearing as tolerated with assist device (walker, cane, etc) as directed, use it as long as suggested by your surgeon or therapist, typically at least 4-6 weeks. ° °POSTOPERATIVE CONSTIPATION PROTOCOL °Constipation - defined medically as fewer than three stools per week and severe constipation as less than one stool per week. ° °One of the most common issues patients have following surgery is constipation.  Even if you have a regular bowel pattern at home, your normal regimen is likely to be disrupted due to multiple reasons following surgery.  Combination of anesthesia, postoperative narcotics, change in appetite and fluid intake all can affect your bowels.  In order to   avoid complications following surgery, here are some recommendations in order to help you during your recovery period. ° °Colace (docusate) - Pick up an over-the-counter form of Colace or another stool softener and take twice a day as long as you are requiring postoperative pain medications.  Take with a full glass of water daily.  If you experience loose stools or diarrhea, hold the colace until you stool forms back up.  If your symptoms do  not get better within 1 week or if they get worse, check with your doctor. ° °Dulcolax (bisacodyl) - Pick up over-the-counter and take as directed by the product packaging as needed to assist with the movement of your bowels.  Take with a full glass of water.  Use this product as needed if not relieved by Colace only.  ° °MiraLax (polyethylene glycol) - Pick up over-the-counter to have on hand.  MiraLax is a solution that will increase the amount of water in your bowels to assist with bowel movements.  Take as directed and can mix with a glass of water, juice, soda, coffee, or tea.  Take if you go more than two days without a movement. °Do not use MiraLax more than once per day. Call your doctor if you are still constipated or irregular after using this medication for 7 days in a row. ° °If you continue to have problems with postoperative constipation, please contact the office for further assistance and recommendations.  If you experience "the worst abdominal pain ever" or develop nausea or vomiting, please contact the office immediatly for further recommendations for treatment. ° °ITCHING ° If you experience itching with your medications, try taking only a single pain pill, or even half a pain pill at a time.  You can also use Benadryl over the counter for itching or also to help with sleep.  ° °TED HOSE STOCKINGS °Wear the elastic stockings on both legs for three weeks following surgery during the day but you may remove then at night for sleeping. ° °MEDICATIONS °See your medication summary on the “After Visit Summary” that the nursing staff will review with you prior to discharge.  You may have some home medications which will be placed on hold until you complete the course of blood thinner medication.  It is important for you to complete the blood thinner medication as prescribed by your surgeon.  Continue your approved medications as instructed at time of discharge. ° °PRECAUTIONS °If you experience chest pain  or shortness of breath - call 911 immediately for transfer to the hospital emergency department.  °If you develop a fever greater that 101 F, purulent drainage from wound, increased redness or drainage from wound, foul odor from the wound/dressing, or calf pain - CONTACT YOUR SURGEON.   °                                                °FOLLOW-UP APPOINTMENTS °Make sure you keep all of your appointments after your operation with your surgeon and caregivers. You should call the office at the above phone number and make an appointment for approximately two weeks after the date of your surgery or on the date instructed by your surgeon outlined in the "After Visit Summary". ° ° °RANGE OF MOTION AND STRENGTHENING EXERCISES  °Rehabilitation of the knee is important following a knee injury or an operation. After   just a few days of immobilization, the muscles of the thigh which control the knee become weakened and shrink (atrophy). Knee exercises are designed to build up the tone and strength of the thigh muscles and to improve knee motion. Often times heat used for twenty to thirty minutes before working out will loosen up your tissues and help with improving the range of motion but do not use heat for the first two weeks following surgery. These exercises can be done on a training (exercise) mat, on the floor, on a table or on a bed. Use what ever works the best and is most comfortable for you Knee exercises include:  °Leg Lifts - While your knee is still immobilized in a splint or cast, you can do straight leg raises. Lift the leg to 60 degrees, hold for 3 sec, and slowly lower the leg. Repeat 10-20 times 2-3 times daily. Perform this exercise against resistance later as your knee gets better.  °Quad and Hamstring Sets - Tighten up the muscle on the front of the thigh (Quad) and hold for 5-10 sec. Repeat this 10-20 times hourly. Hamstring sets are done by pushing the foot backward against an object and holding for 5-10  sec. Repeat as with quad sets.  °· Leg Slides: Lying on your back, slowly slide your foot toward your buttocks, bending your knee up off the floor (only go as far as is comfortable). Then slowly slide your foot back down until your leg is flat on the floor again. °· Angel Wings: Lying on your back spread your legs to the side as far apart as you can without causing discomfort.  °A rehabilitation program following serious knee injuries can speed recovery and prevent re-injury in the future due to weakened muscles. Contact your doctor or a physical therapist for more information on knee rehabilitation.  ° °IF YOU ARE TRANSFERRED TO A SKILLED REHAB FACILITY °If the patient is transferred to a skilled rehab facility following release from the hospital, a list of the current medications will be sent to the facility for the patient to continue.  When discharged from the skilled rehab facility, please have the facility set up the patient's Home Health Physical Therapy prior to being released. Also, the skilled facility will be responsible for providing the patient with their medications at time of release from the facility to include their pain medication, the muscle relaxants, and their blood thinner medication. If the patient is still at the rehab facility at time of the two week follow up appointment, the skilled rehab facility will also need to assist the patient in arranging follow up appointment in our office and any transportation needs. ° °MAKE SURE YOU:  °Understand these instructions.  °Get help right away if you are not doing well or get worse.  ° ° °Pick up stool softner and laxative for home use following surgery while on pain medications. °Do not submerge incision under water. °Please use good hand washing techniques while changing dressing each day. °May shower starting three days after surgery. °Please use a clean towel to pat the incision dry following showers. °Continue to use ice for pain and swelling  after surgery. °Do not use any lotions or creams on the incision until instructed by your surgeon. °

## 2018-07-18 NOTE — Op Note (Signed)
NAMETELA, KOTECKI MEDICAL RECORD IN:[id number] ACCOUNT [id number] DATE OF BIRTH:09-13-1950 FACILITY: [facility] LOCATION: [facility] PHYSICIAN:Kelci Petrella Fransico Setters, MD  OPERATIVE REPORT  DATE OF PROCEDURE:  07/18/2018  SURGEON:  [name], MD  ASSISTANT:  Ardeen Jourdain PA.  PREOPERATIVE DIAGNOSES:  Severe flexion contracture was severe primary osteoarthritis with bone-on-bone, left knee.  POSTOPERATIVE DIAGNOSES:  Severe flexion contracture was severe primary osteoarthritis with bone-on-bone, left knee.  PROCEDURE:  Left total knee arthroplasty utilizing the DePuy system.  All 3 components were cemented.  The sizes used was a size 3 tibial tray, a size 3 left femoral component and posterior cruciate sacrificing type.  The patella component was a size 38  and the insert was a rotating platform size 3, 17 mm thickness.  OPERATION: 1.  Release of contractures, left knee. 2.  Left total knee arthroplasty, utilizing the above-mentioned sizes.  All 3 components were cemented.  DESCRIPTION OF PROCEDURE:  Note, at this time, the appropriate timeout was carried out.  I also marked the appropriate left leg in the holding area.  The patient had 2 grams of IV Ancef preop.  She also had a gram of TFA.  At this point, after the leg  was exsanguinated with an Esmarch, the tourniquet was elevated to 325 mmHg.  The patient had a spinal anesthetic with a left femoral nerve block.  At this time, left leg then was placed in [facility] knee holder and flexed.  Anterior approach to knee was  carried out.  Bleeders identified and cauterized.  I then created 2 flaps.  Following this, I did a median parapatellar incision, reflected the patella laterally and did medial and lateral meniscectomies and excised the anterior and posterior cruciate  ligaments.  At this particular time, we then made our initial drill hole in the femoral notch and inserted our guide rod and the first jig was inserted.  We did a resection of  the distal femur at 12 mm thickness.  The knee was extremely tight.  At this  particular time.  We measured the femur to be a size 3.  We did our anterior, posterior chamfering cuts for a size 3 femoral component.  We later had to go back and take 2 mm extra cut.  Following that, we then prepared our tibia in the usual fashion for  a size 3.  We carried out the appropriate keel cut, but that was done after we inserted the lamina spreaders and removed our posterior spurs from the femoral condyle.  After this was carried out, we then did our notch our keel cut and the tibia after we  removed the approximate amount of bone utilizing the external guide.  We then did our notch cut on the distal femur.  We then inserted our trial components with through 3 range of motion.  Initially, the knee was a little tight in extension.  We removed  2 mm more bone on the distal femur,  redid our notch cut as well.  After that was done, we then reinserted our trial components.  We had a nice fit in flexion and extension.  We did a resurfacing procedure on the patella in the usual fashion for a 38  patella.  Three drill holes were made in the articular surface of the patella.  All components were then removed.  We water picked out the knee and cemented all 3 components in simultaneously.  All loose pieces of cement were removed.  We then irrigated  the knee  out again and finally inserted our permanent rotating platform size 3, 17 mm thickness.  We then inserted a Hemovac drain, closed the wound in layers in usual fashion.    Of note, I had to do a nice medial release because of the extreme tightness of the knee.  Sterile dressings were applied after the wound was closed.   Also prior to closing the wound, we injected 20 mL of Exparel with normal saline 20 mL.  The patient  will be admitted to the hospital for pain control.  AN/NUANCE  D:07/18/2018 T:07/18/2018 JOB:[id number]

## 2018-07-18 NOTE — Anesthesia Postprocedure Evaluation (Signed)
Anesthesia Post Note  Patient: [name]  Procedure(s) Performed: TOTAL KNEE ARTHROPLASTY (Left )     Patient location during evaluation: PACU Anesthesia Type: Spinal Level of consciousness: oriented and awake and alert Pain management: pain level controlled Vital Signs Assessment: post-procedure vital signs reviewed and stable Respiratory status: spontaneous breathing, respiratory function stable and nonlabored ventilation Cardiovascular status: blood pressure returned to baseline and stable Postop Assessment: no headache, no backache, no apparent nausea or vomiting, patient able to bend at knees and spinal receding Anesthetic complications: no    Last Vitals:  Vitals:   07/18/18 1115 07/18/18 1147  BP: 127/67 (!) 145/68  Pulse: 78 72  Resp: 17 16  Temp: 36.7 C 36.6 C  SpO2: 100% 100%    Last Pain:  Vitals:   07/18/18 1147  TempSrc: Oral  PainSc:    Pain Goal: Patients Stated Pain Goal: 3 (07/18/18 0651)  LLE Motor Response: Purposeful movement (07/18/18 1100)   RLE Motor Response: Purposeful movement (07/18/18 1100) RLE Sensation: Decreased (07/18/18 1100) L Sensory Level: L3-Anterior knee, lower leg (07/18/18 1100) R Sensory Level: L3-Anterior knee, lower leg (07/18/18 1100)    Cipriana Biller A.

## 2018-07-18 NOTE — Interval H&P Note (Signed)
History and Physical Interval Note:  07/18/2018 8:12 AM  [name]  has presented today for surgery, with the diagnosis of left knee osteoarthritis.  The various methods of treatment have been discussed with the patient and family. After consideration of risks, benefits and other options for treatment, the patient has consented to  Procedure(s) with comments: TOTAL KNEE ARTHROPLASTY (Left) - 176min as a surgical intervention.  The patient's history has been reviewed, patient examined, no change in status, stable for surgery.  I have reviewed the patient's chart and labs.  Questions were answered to the patient's satisfaction.     [name]

## 2018-07-18 NOTE — Evaluation (Signed)
Physical Therapy Evaluation Patient Details Name: [name] MRN: [id number] DOB: 27-Jun-1950 Today's Date: 07/18/2018   History of Present Illness  Pt is a 68 year old female s/p L TKA  Clinical Impression  Pt admitted with above diagnosis. Pt currently with functional limitations due to the deficits listed below (see PT Problem List).  Pt will benefit from skilled PT to increase their independence and safety with mobility to allow discharge to the venue listed below.   Pt assisted with ambulating short distance in hallway POD #0. Pt plans to d/c home with spouse and have OP PT.     Follow Up Recommendations Follow surgeon's recommendation for DC plan and follow-up therapies(plans for OPPT)    Equipment Recommendations  Rolling walker with 5" wheels    Recommendations for Other Services       Precautions / Restrictions Precautions Precautions: Fall;Knee Required Braces or Orthoses: Knee Immobilizer - Left Restrictions Other Position/Activity Restrictions: WBAT      Mobility  Bed Mobility Overal bed mobility: Needs Assistance Bed Mobility: Supine to Sit     Supine to sit: Min guard;HOB elevated     General bed mobility comments: verbal cues for technique  Transfers Overall transfer level: Needs assistance Equipment used: Rolling walker (2 wheeled) Transfers: Sit to/from Stand Sit to Stand: Min assist         General transfer comment: min/guard for safety, cues for UE and LE positioning, assist to control descent  Ambulation/Gait Ambulation/Gait assistance: Min guard Gait Distance (Feet): 100 Feet Assistive device: Rolling walker (2 wheeled) Gait Pattern/deviations: Step-to pattern;Decreased stance time - left;Antalgic     General Gait Details: verbal cues for sequence, RW positioning, step length  Stairs            Wheelchair Mobility    Modified Rankin (Stroke Patients Only)       Balance                                             Pertinent Vitals/Pain Pain Assessment: 0-10 Pain Score: 5  Pain Location: L knee Pain Descriptors / Indicators: Sore;Aching Pain Intervention(s): Monitored during session;Repositioned;Limited activity within patient's tolerance;Premedicated before session;Ice applied    Home Living Family/patient expects to be discharged to:: Private residence Living Arrangements: Spouse/significant other Available Help at Discharge: Family Type of Home: House Home Access: Stairs to enter   Technical brewer of Steps: 1 "stoop" Home Layout: One level Home Equipment: None      Prior Function Level of Independence: Independent               Hand Dominance        Extremity/Trunk Assessment        Lower Extremity Assessment Lower Extremity Assessment: LLE deficits/detail LLE Deficits / Details: unable to perform SLR, maintained KI, ROM TBA       Communication   Communication: No difficulties  Cognition Arousal/Alertness: Awake/alert Behavior During Therapy: WFL for tasks assessed/performed Overall Cognitive Status: Within Functional Limits for tasks assessed                                        General Comments      Exercises     Assessment/Plan    PT Assessment Patient needs continued PT services  PT  Problem List Decreased strength;Decreased mobility;Decreased range of motion;Decreased knowledge of use of DME;Pain;Decreased knowledge of precautions       PT Treatment Interventions DME instruction;Functional mobility training;Gait training;Therapeutic activities;Stair training;Therapeutic exercise;Balance training;Patient/family education    PT Goals (Current goals can be found in the Care Plan section)  Acute Rehab PT Goals PT Goal Formulation: With patient Time For Goal Achievement: 07/21/18 Potential to Achieve Goals: Good    Frequency 7X/week   Barriers to discharge        Co-evaluation               AM-PAC PT  "6 Clicks" Mobility  Outcome Measure Help needed turning from your back to your side while in a flat bed without using bedrails?: A Little Help needed moving from lying on your back to sitting on the side of a flat bed without using bedrails?: A Little Help needed moving to and from a bed to a chair (including a wheelchair)?: A Little Help needed standing up from a chair using your arms (e.g., wheelchair or bedside chair)?: A Little Help needed to walk in hospital room?: A Little Help needed climbing 3-5 steps with a railing? : A Lot 6 Click Score: 17    End of Session Equipment Utilized During Treatment: Gait belt;Left knee immobilizer Activity Tolerance: Patient tolerated treatment well Patient left: with call bell/phone within reach;in chair;with chair alarm set;with family/visitor present   PT Visit Diagnosis: Other abnormalities of gait and mobility (R26.89)    Time: 2229-7989 PT Time Calculation (min) (ACUTE ONLY): 18 min   Charges:   PT Evaluation $PT Eval Low Complexity: [place], PT, DPT Acute Rehabilitation Services Office: [phone] Pager: [phone]   Trena Platt 07/18/2018, 4:05 PM

## 2018-07-18 NOTE — Anesthesia Preprocedure Evaluation (Signed)
Anesthesia Evaluation  Patient identified by MRN, date of birth, ID band Patient awake    Reviewed: Allergy & Precautions, NPO status , Patient's Chart, lab work & pertinent test results  Airway Mallampati: II  TM Distance: >3 FB Neck ROM: Full    Dental no notable dental hx. (+) Teeth Intact   Pulmonary pneumonia, resolved,    Pulmonary exam normal breath sounds clear to auscultation       Cardiovascular negative cardio ROS Normal cardiovascular exam Rhythm:Regular Rate:Normal     Neuro/Psych Anxiety negative neurological ROS     GI/Hepatic Neg liver ROS, GERD  Medicated and Controlled,  Endo/Other  negative endocrine ROS  Renal/GU negative Renal ROS  negative genitourinary   Musculoskeletal  (+) Arthritis , Osteoarthritis,  OA left knee    Abdominal   Peds  Hematology negative hematology ROS (+)   Anesthesia Other Findings   Reproductive/Obstetrics                             Anesthesia Physical Anesthesia Plan  ASA: II  Anesthesia Plan: Spinal   Post-op Pain Management:  Regional for Post-op pain   Induction:   PONV Risk Score and Plan: Ondansetron, Treatment may vary due to age or medical condition and Propofol infusion  Airway Management Planned: Natural Airway and Simple Face Mask  Additional Equipment:   Intra-op Plan:   Post-operative Plan:   Informed Consent: I have reviewed the patients History and Physical, chart, labs and discussed the procedure including the risks, benefits and alternatives for the proposed anesthesia with the patient or authorized representative who has indicated his/her understanding and acceptance.     Dental advisory given  Plan Discussed with: CRNA and Surgeon  Anesthesia Plan Comments:         Anesthesia Quick Evaluation

## 2018-07-19 ENCOUNTER — Encounter (HOSPITAL_COMMUNITY): Payer: Self-pay | Admitting: Orthopedic Surgery

## 2018-07-19 LAB — BASIC METABOLIC PANEL
ANION GAP: 8 (ref 5–15)
BUN: 12 mg/dL (ref 8–23)
CO2: 28 mmol/L (ref 22–32)
Calcium: 8.4 mg/dL — ABNORMAL LOW (ref 8.9–10.3)
Chloride: 101 mmol/L (ref 98–111)
Creatinine, Ser: 0.7 mg/dL (ref 0.44–1.00)
GFR calc Af Amer: >60 mL/min (ref >60)
GFR calc non Af Amer: >60 mL/min (ref >60)
Glucose, Bld: 147 mg/dL — ABNORMAL HIGH (ref 70–99)
Potassium: 4.1 mmol/L (ref 3.5–5.1)
SODIUM: 137 mmol/L (ref 135–145)

## 2018-07-19 LAB — CBC
HCT: 34 % — ABNORMAL LOW (ref 36.0–46.0)
Hemoglobin: 10.7 g/dL — ABNORMAL LOW (ref 12.0–15.0)
MCH: 28.4 pg (ref 26.0–34.0)
MCHC: 31.5 g/dL (ref 30.0–36.0)
MCV: 90.2 fL (ref 80.0–100.0)
Platelets: 156 10*3/uL (ref 150–400)
RBC: 3.77 MIL/uL — ABNORMAL LOW (ref 3.87–5.11)
RDW: 13.1 % (ref 11.5–15.5)
WBC: 8.1 10*3/uL (ref 4.0–10.5)
nRBC: 0 % (ref 0.0–0.2)

## 2018-07-19 MED ORDER — OXYCODONE HCL 5 MG PO TABS: 5.0000 mg | ORAL_TABLET | Freq: Four times a day (QID) | ORAL | 0 refills | Status: DC | PRN

## 2018-07-19 MED ORDER — ASPIRIN EC 325 MG PO TBEC: 325.0000 mg | DELAYED_RELEASE_TABLET | Freq: Two times a day (BID) | ORAL | 0 refills | Status: DC

## 2018-07-19 MED ORDER — METHOCARBAMOL 500 MG PO TABS: 500.0000 mg | ORAL_TABLET | Freq: Four times a day (QID) | ORAL | 0 refills | Status: DC | PRN

## 2018-07-19 NOTE — TOC Transition Note (Signed)
Transition of Care [facility]) - CM/SW Discharge Note   Patient Details  Name: [name] MRN: [id number] Date of Birth: 14-Jul-1950  [facility] CM/SW Contact:  Leeroy Cha, RN Phone Number: 07/19/2018, 2:31 PM   Clinical Narrative:    Discharged to home L TKA scheduled 07-18-2018 DCP:  Home with spouse.  1 story home with 1 ste. DME:  RW ordered through [facility].  Does not need a 3-in-1.  Has elevated toilets.  PT:  [facility].  PT eval scheduled on 07-23-2018.                  Final next level of care: Home/Self Care Barriers to Discharge: No Barriers Identified   Patient Goals and CMS Choice Patient states their goals for this hospitalization and ongoing recovery are:: i want to go home and be normal      Discharge Placement                       Discharge Plan and Services   Discharge Planning Services: CM Consult            DME Arranged: Walker rolling DME Agency: [facility] HH Arranged: NA [place] Agency: NA   Social Determinants of Health (SDOH) Interventions     Readmission Risk Interventions No flowsheet data found.

## 2018-07-19 NOTE — Progress Notes (Signed)
Subjective: 1 Day Post-Op Procedure(s) (LRB): TOTAL KNEE ARTHROPLASTY (Left) Patient reports pain as 2 on 0-10 scale.Doing very well this morning. Hemovac DCd and dressing changed. Wound looks fine. She has been up ambulating.    Objective: Vital signs in last 24 hours: Temp:  [97.8 F (36.6 C)-98.8 F (37.1 C)] 98.3 F (36.8 C) (03/19 0524) Pulse Rate:  [64-99] 83 (03/19 0524) Resp:  [10-17] 16 (03/18 1710) BP: (108-156)/(66-88) 139/66 (03/19 0524) SpO2:  [97 %-100 %] 100 % (03/19 0524)  Intake/Output from previous day: 03/18 0701 - 03/19 0700 In: 4837.4 [P.O.:1190; I.V.:3297.4; IV Piggyback:350] Out: 3020 [Urine:2850; Drains:120; Blood:50] Intake/Output this shift: No intake/output data recorded.  Recent Labs    07/19/18 0437  HGB 10.7*   Recent Labs    07/19/18 0437  WBC 8.1  RBC 3.77*  HCT 34.0*  PLT 156   Recent Labs    07/19/18 0437  NA 137  K 4.1  CL 101  CO2 28  BUN 12  CREATININE 0.70  GLUCOSE 147*  CALCIUM 8.4*   No results for input(s): LABPT, INR in the last 72 hours.  Dorsiflexion/Plantar flexion intact No cellulitis present Compartment soft   Assessment/Plan: 1 Day Post-Op Procedure(s) (LRB): TOTAL KNEE ARTHROPLASTY (Left) Up with therapy   Anticipated LOS equal to or greater than 2 midnights due to - Age 70 and older with one or more of the following:  - Obesity  - Expected need for hospital services (PT, OT, Nursing) required for safe  discharge  - Anticipated need for postoperative skilled nursing care or inpatient rehab  - Active co-morbidities: None OR   - Unanticipated findings during/Post Surgery: None  - Patient is a high risk of re-admission due to: None    [name] 07/19/2018, 7:18 AM

## 2018-07-19 NOTE — Progress Notes (Signed)
Physical Therapy Treatment Patient Details Name: [name] MRN: [id number] DOB: 08/05/1950 Today's Date: 07/19/2018    History of Present Illness Pt is a 69 year old female s/p L TKA    PT Comments    Pt performed LE exercises and ambulated in hallway.  Pt reports more pain this afternoon however agreeable to mobilize as tolerated.  Pt anticipates d/c home tomorrow.   Follow Up Recommendations  Follow surgeon's recommendation for DC plan and follow-up therapies     Equipment Recommendations  Rolling walker with 5" wheels    Recommendations for Other Services       Precautions / Restrictions Precautions Precautions: Fall;Knee Required Braces or Orthoses: Knee Immobilizer - Left Restrictions LLE Weight Bearing: Weight bearing as tolerated Other Position/Activity Restrictions: WBAT    Mobility  Bed Mobility               General bed mobility comments: pt up in recliner on arrival  Transfers Overall transfer level: Needs assistance Equipment used: Rolling walker (2 wheeled) Transfers: Sit to/from Stand Sit to Stand: Supervision         General transfer comment: cues for UE and LE positioning for pain control  Ambulation/Gait Ambulation/Gait assistance: Min guard Gait Distance (Feet): 140 Feet Assistive device: Rolling walker (2 wheeled) Gait Pattern/deviations: Step-to pattern;Decreased stance time - left;Antalgic Gait velocity: decr   General Gait Details: verbal cues for sequence, RW positioning, step length   Stairs             Wheelchair Mobility    Modified Rankin (Stroke Patients Only)       Balance                                            Cognition Arousal/Alertness: Awake/alert Behavior During Therapy: WFL for tasks assessed/performed Overall Cognitive Status: Within Functional Limits for tasks assessed                                        Exercises Total Joint Exercises Ankle  Circles/Pumps: AROM;10 reps Quad Sets: 10 reps;AROM;Left Short Arc Quad: 10 reps;Left;AAROM Heel Slides: 10 reps;Left;AAROM Hip ABduction/ADduction: AAROM;10 reps;Left Straight Leg Raises: AAROM;10 reps;Left Goniometric ROM: approx L knee AAROM -10-40*    General Comments        Pertinent Vitals/Pain Pain Assessment: 0-10 Pain Score: 6  Pain Location: L knee Pain Descriptors / Indicators: Sore;Aching Pain Intervention(s): Limited activity within patient's tolerance;Monitored during session;Premedicated before session;Repositioned;Ice applied    Home Living                      Prior Function            PT Goals (current goals can now be found in the care plan section) Progress towards PT goals: Progressing toward goals    Frequency    7X/week      PT Plan Current plan remains appropriate    Co-evaluation              AM-PAC PT "6 Clicks" Mobility   Outcome Measure  Help needed turning from your back to your side while in a flat bed without using bedrails?: A Little Help needed moving from lying on your back to sitting on the side of a flat  bed without using bedrails?: A Little Help needed moving to and from a bed to a chair (including a wheelchair)?: A Little Help needed standing up from a chair using your arms (e.g., wheelchair or bedside chair)?: A Little Help needed to walk in hospital room?: A Little Help needed climbing 3-5 steps with a railing? : A Lot 6 Click Score: 17    End of Session Equipment Utilized During Treatment: Gait belt Activity Tolerance: Patient tolerated treatment well Patient left: with call bell/phone within reach;in chair   PT Visit Diagnosis: Other abnormalities of gait and mobility (R26.89)     Time: 4514-6047 PT Time Calculation (min) (ACUTE ONLY): 21 min  Charges:  $Gait Training: 8-22 mins $Therapeutic Exercise: 8-22 mins                    Carmelia Bake, PT, DPT Acute Rehabilitation Services Office:  [phone] Pager: [place] 07/19/2018, 3:49 PM

## 2018-07-19 NOTE — Progress Notes (Signed)
Physical Therapy Treatment Patient Details Name: [name] MRN: [id number] DOB: April 09, 1951 Today's Date: 07/19/2018    History of Present Illness Pt is a 68 year old female s/p L TKA    PT Comments    Pt ambulated in hallway and assisted to recliner.  Pt awaiting muscle relaxer so would like to perform exercises this afternoon.  Pt reports d/c home tomorrow.   Follow Up Recommendations  Follow surgeon's recommendation for DC plan and follow-up therapies     Equipment Recommendations  Rolling walker with 5" wheels    Recommendations for Other Services       Precautions / Restrictions Precautions Precautions: Fall;Knee Required Braces or Orthoses: Knee Immobilizer - Left Restrictions Weight Bearing Restrictions: Yes LLE Weight Bearing: Weight bearing as tolerated Other Position/Activity Restrictions: WBAT    Mobility  Bed Mobility               General bed mobility comments: pt up in bathroom on arrival  Transfers Overall transfer level: Needs assistance Equipment used: Rolling walker (2 wheeled) Transfers: Sit to/from Stand Sit to Stand: Supervision         General transfer comment: cues for UE and LE positioning for pain control  Ambulation/Gait Ambulation/Gait assistance: Min guard Gait Distance (Feet): 140 Feet Assistive device: Rolling walker (2 wheeled) Gait Pattern/deviations: Step-to pattern;Decreased stance time - left;Antalgic Gait velocity: decr   General Gait Details: verbal cues for sequence, RW positioning, step length   Stairs             Wheelchair Mobility    Modified Rankin (Stroke Patients Only)       Balance                                            Cognition Arousal/Alertness: Awake/alert Behavior During Therapy: WFL for tasks assessed/performed Overall Cognitive Status: Within Functional Limits for tasks assessed                                        Exercises       General Comments        Pertinent Vitals/Pain Pain Assessment: 0-10 Pain Score: 4  Pain Location: L knee Pain Descriptors / Indicators: Sore;Aching Pain Intervention(s): Monitored during session;Repositioned;Limited activity within patient's tolerance;Patient requesting pain meds-RN notified    Home Living                      Prior Function            PT Goals (current goals can now be found in the care plan section) Progress towards PT goals: Progressing toward goals    Frequency    7X/week      PT Plan Current plan remains appropriate    Co-evaluation              AM-PAC PT "6 Clicks" Mobility   Outcome Measure  Help needed turning from your back to your side while in a flat bed without using bedrails?: A Little Help needed moving from lying on your back to sitting on the side of a flat bed without using bedrails?: A Little Help needed moving to and from a bed to a chair (including a wheelchair)?: A Little Help needed standing up from a chair  using your arms (e.g., wheelchair or bedside chair)?: A Little Help needed to walk in hospital room?: A Little Help needed climbing 3-5 steps with a railing? : A Lot 6 Click Score: 17    End of Session Equipment Utilized During Treatment: Gait belt Activity Tolerance: Patient tolerated treatment well Patient left: with call bell/phone within reach;in chair;with chair alarm set   PT Visit Diagnosis: Other abnormalities of gait and mobility (R26.89)     Time: 0955-1005 PT Time Calculation (min) (ACUTE ONLY): 10 min  Charges:  $Gait Training: 8-22 mins                     Carmelia Bake, PT, [place] Office: [phone] Pager: [phone]  Trena Platt 07/19/2018, 12:39 PM

## 2018-07-20 ENCOUNTER — Encounter (HOSPITAL_COMMUNITY): Payer: Self-pay | Admitting: Orthopedic Surgery

## 2018-07-20 LAB — CBC
HCT: 32.8 % — ABNORMAL LOW (ref 36.0–46.0)
HEMOGLOBIN: 10 g/dL — AB (ref 12.0–15.0)
MCH: 28 pg (ref 26.0–34.0)
MCHC: 30.5 g/dL (ref 30.0–36.0)
MCV: 91.9 fL (ref 80.0–100.0)
Platelets: 155 10*3/uL (ref 150–400)
RBC: 3.57 MIL/uL — ABNORMAL LOW (ref 3.87–5.11)
RDW: 13.2 % (ref 11.5–15.5)
WBC: 9.7 10*3/uL (ref 4.0–10.5)
nRBC: 0 % (ref 0.0–0.2)

## 2018-07-20 LAB — BASIC METABOLIC PANEL
Anion gap: 8 (ref 5–15)
BUN: 11 mg/dL (ref 8–23)
CO2: 27 mmol/L (ref 22–32)
Calcium: 8.3 mg/dL — ABNORMAL LOW (ref 8.9–10.3)
Chloride: 102 mmol/L (ref 98–111)
Creatinine, Ser: 0.71 mg/dL (ref 0.44–1.00)
GFR calc Af Amer: >60 mL/min (ref >60)
Glucose, Bld: 139 mg/dL — ABNORMAL HIGH (ref 70–99)
POTASSIUM: 3.8 mmol/L (ref 3.5–5.1)
SODIUM: 137 mmol/L (ref 135–145)

## 2018-07-20 NOTE — Progress Notes (Signed)
Physical Therapy Treatment Patient Details Name: [name] MRN: [id number] DOB: 10/17/1950 Today's Date: 07/20/2018    History of Present Illness Pt is a 68 year old female s/p L TKA    PT Comments    Pt ambulated in hallway and performed LE exercises.  Pt feels ready for d/c home today.  Pt provided with HEP handout and had no further questions.    Follow Up Recommendations  Follow surgeon's recommendation for DC plan and follow-up therapies     Equipment Recommendations  Rolling walker with 5" wheels    Recommendations for Other Services       Precautions / Restrictions Precautions Precautions: Fall;Knee Restrictions LLE Weight Bearing: Weight bearing as tolerated    Mobility  Bed Mobility Overal bed mobility: Needs Assistance Bed Mobility: Supine to Sit     Supine to sit: Supervision        Transfers Overall transfer level: Needs assistance Equipment used: Rolling walker (2 wheeled) Transfers: Sit to/from Stand Sit to Stand: Supervision         General transfer comment: cues for UE and LE positioning for pain control  Ambulation/Gait Ambulation/Gait assistance: Min guard;Supervision Gait Distance (Feet): 150 Feet Assistive device: Rolling walker (2 wheeled) Gait Pattern/deviations: Step-to pattern;Decreased stance time - left;Antalgic;Step-through pattern Gait velocity: decr   General Gait Details: verbal cues for sequence, RW positioning, step length   Stairs             Wheelchair Mobility    Modified Rankin (Stroke Patients Only)       Balance                                            Cognition Arousal/Alertness: Awake/alert Behavior During Therapy: WFL for tasks assessed/performed Overall Cognitive Status: Within Functional Limits for tasks assessed                                        Exercises Total Joint Exercises Ankle Circles/Pumps: AROM;10 reps;Both Quad Sets: 10  reps;AROM;Left Short Arc Quad: 10 reps;Left;AAROM Heel Slides: 10 reps;Left;AAROM Hip ABduction/ADduction: 10 reps;Left;AROM Straight Leg Raises: AAROM;10 reps;Left    General Comments        Pertinent Vitals/Pain Pain Assessment: 0-10 Pain Score: 5  Pain Location: L knee Pain Descriptors / Indicators: Sore;Aching Pain Intervention(s): Limited activity within patient's tolerance;Monitored during session;Repositioned    Home Living                      Prior Function            PT Goals (current goals can now be found in the care plan section) Progress towards PT goals: Progressing toward goals    Frequency    7X/week      PT Plan Current plan remains appropriate    Co-evaluation              AM-PAC PT "6 Clicks" Mobility   Outcome Measure  Help needed turning from your back to your side while in a flat bed without using bedrails?: A Little Help needed moving from lying on your back to sitting on the side of a flat bed without using bedrails?: A Little Help needed moving to and from a bed to a chair (including a wheelchair)?:  A Little Help needed standing up from a chair using your arms (e.g., wheelchair or bedside chair)?: A Little Help needed to walk in hospital room?: A Little Help needed climbing 3-5 steps with a railing? : A Little 6 Click Score: 18    End of Session   Activity Tolerance: Patient tolerated treatment well Patient left: with call bell/phone within reach;in chair   PT Visit Diagnosis: Other abnormalities of gait and mobility (R26.89)     Time: 0100-7121 PT Time Calculation (min) (ACUTE ONLY): 25 min  Charges:  $Gait Training: 8-22 mins $Therapeutic Exercise: 8-22 mins                     Carmelia Bake, PT, DPT Acute Rehabilitation Services Office: [phone] Pager: [place] E 07/20/2018, 12:16 PM

## 2018-07-20 NOTE — Progress Notes (Signed)
Reviewed discharge medications and prescriptions with patient and husband. Patient verbalize understanding and has no further questions.

## 2018-07-20 NOTE — Progress Notes (Signed)
   Subjective: 2 Days Post-Op Procedure(s) (LRB): TOTAL KNEE ARTHROPLASTY (Left) Patient reports pain as mild.   Patient seen in rounds for Dr. Gladstone Lighter. Patient is well, and has had no acute complaints or problems. Denies SOB and chest pain. Voiding well. Positive flatus. RN reports a fever last night that decreased with Tylenol. Reports that therapy is doing well.  Plan is to go Home after hospital stay.  Objective: Vital signs in last 24 hours: Temp:  [98.3 F (36.8 C)-100.7 F (38.2 C)] 98.6 F (37 C) (03/20 0336) Pulse Rate:  [89-105] 97 (03/20 0336) Resp:  [15-18] 16 (03/20 0336) BP: (120-142)/(60-69) 120/64 (03/20 0336) SpO2:  [97 %-100 %] 97 % (03/20 0336)  Intake/Output from previous day:  Intake/Output Summary (Last 24 hours) at 07/20/2018 0723 Last data filed at 07/20/2018 0600 Gross per 24 hour  Intake 2390 ml  Output 400 ml  Net 1990 ml     Labs: Recent Labs    07/19/18 0437 07/20/18 0418  HGB 10.7* 10.0*   Recent Labs    07/19/18 0437 07/20/18 0418  WBC 8.1 9.7  RBC 3.77* 3.57*  HCT 34.0* 32.8*  PLT 156 155   Recent Labs    07/19/18 0437 07/20/18 0418  NA 137 137  K 4.1 3.8  CL 101 102  CO2 28 27  BUN 12 11  CREATININE 0.70 0.71  GLUCOSE 147* 139*  CALCIUM 8.4* 8.3*    EXAM General - Patient is Alert and Oriented Extremity - Neurologically intact Intact pulses distally Dorsiflexion/Plantar flexion intact No cellulitis present Compartment soft Dressing/Incision - clean, dry, no drainage Motor Function - intact, moving foot and toes well on exam.   Past Medical History:  Diagnosis Date  . Anxiety   . Arthritis   . GERD (gastroesophageal reflux disease)   . Pneumonia     Assessment/Plan: 2 Days Post-Op Procedure(s) (LRB): TOTAL KNEE ARTHROPLASTY (Left) Active Problems:   H/O total knee replacement, left  Estimated body mass index is 29.99 kg/m as calculated from the following:   Height as of this encounter: 5\' 7"  (1.702 m).    Weight as of this encounter: 86.9 kg. Advance diet Up with therapy Discharge home   DVT Prophylaxis - Xarelto- will transition to aspirin BID once at home Weight-Bearing as tolerated   Continue therapy this morning. Monitor for fever. If progressing well, DC home today. Plan to start outpatient therapy on Monday. Discharge and follow up instructions given.    Ardeen Jourdain, PA-C Orthopaedic Surgery 07/20/2018, 7:23 AM

## 2018-07-23 DIAGNOSIS — M25562 Pain in left knee: Secondary | ICD-10-CM | POA: Diagnosis not present

## 2018-07-23 NOTE — Discharge Summary (Signed)
Physician Discharge Summary   Patient ID: [name] MRN: [id number] DOB/AGE: February 18, 1951 68 y.o.  Admit date: 07/18/2018 Discharge date: 07/20/2018  Primary Diagnosis: Primary osteoarthritis left knee  Admission Diagnoses:  Past Medical History:  Diagnosis Date   Anxiety    Arthritis    GERD (gastroesophageal reflux disease)    Pneumonia    Discharge Diagnoses:   Active Problems:   H/O total knee replacement, left  Estimated body mass index is 29.99 kg/m as calculated from the following:   Height as of this encounter: 5\' 7"  (1.702 m).   Weight as of this encounter: 86.9 kg.  Procedure:  Procedure(s) (LRB): TOTAL KNEE ARTHROPLASTY (Left)   Consults: None  HPI: [name], 68 y.o. female, has a history of pain and functional disability in the left knee due to arthritis and has failed non-surgical conservative treatments for greater than 12 weeks to includeNSAID's and/or analgesics, corticosteriod injections, viscosupplementation injections, flexibility and strengthening excercises and activity modification.  Onset of symptoms was gradual, starting 5 years ago with gradually worsening course since that time. The patient noted no past surgery on the left knee(s).  Patient currently rates pain in the left knee(s) at 8 out of 10 with activity. Patient has night pain, worsening of pain with activity and weight bearing, pain that interferes with activities of daily living, pain with passive range of motion, crepitus and joint swelling.  Patient has evidence of periarticular osteophytes and joint space narrowing by imaging studies. There is no active infection.  Laboratory Data: Admission on 07/18/2018, Discharged on 07/20/2018  Component Date Value Ref Range Status   WBC 07/19/2018 8.1  4.0 - 10.5 K/uL Final   RBC 07/19/2018 3.77* 3.87 - 5.11 MIL/uL Final   Hemoglobin 07/19/2018 10.7* 12.0 - 15.0 g/dL Final   HCT 07/19/2018 34.0* 36.0 - 46.0 % Final   MCV 07/19/2018  90.2  80.0 - 100.0 fL Final   MCH 07/19/2018 28.4  26.0 - 34.0 pg Final   MCHC 07/19/2018 31.5  30.0 - 36.0 g/dL Final   RDW 07/19/2018 13.1  11.5 - 15.5 % Final   Platelets 07/19/2018 156  150 - 400 K/uL Final   nRBC 07/19/2018 0.0  0.0 - 0.2 % Final   Performed at [facility], [place] [address], [place], [place] [address]   Sodium 07/19/2018 137  135 - 145 mmol/L Final   Potassium 07/19/2018 4.1  3.5 - 5.1 mmol/L Final   Chloride 07/19/2018 101  98 - 111 mmol/L Final   CO2 07/19/2018 28  22 - 32 mmol/L Final   Glucose, Bld 07/19/2018 147* 70 - 99 mg/dL Final   BUN 07/19/2018 12  8 - 23 mg/dL Final   Creatinine, Ser 07/19/2018 0.70  0.44 - 1.00 mg/dL Final   Calcium 07/19/2018 8.4* 8.9 - 10.3 mg/dL Final   GFR calc non Af Amer 07/19/2018 >60  >60 mL/min Final   GFR calc Af Amer 07/19/2018 >60  >60 mL/min Final   Anion gap 07/19/2018 8  5 - 15 Final   Performed at [facility], [place] [address], [place], [place] [address]   WBC 07/20/2018 9.7  4.0 - 10.5 K/uL Final   RBC 07/20/2018 3.57* 3.87 - 5.11 MIL/uL Final   Hemoglobin 07/20/2018 10.0* 12.0 - 15.0 g/dL Final   HCT 07/20/2018 32.8* 36.0 - 46.0 % Final   MCV 07/20/2018 91.9  80.0 - 100.0 fL Final   MCH 07/20/2018 28.0  26.0 - 34.0  pg Final   MCHC 07/20/2018 30.5  30.0 - 36.0 g/dL Final   RDW 07/20/2018 13.2  11.5 - 15.5 % Final   Platelets 07/20/2018 155  150 - 400 K/uL Final   nRBC 07/20/2018 0.0  0.0 - 0.2 % Final   Performed at [facility], [place] [address] [place], [place] [address]   Sodium 07/20/2018 137  135 - 145 mmol/L Final   Potassium 07/20/2018 3.8  3.5 - 5.1 mmol/L Final   Chloride 07/20/2018 102  98 - 111 mmol/L Final   CO2 07/20/2018 27  22 - 32 mmol/L Final   Glucose, Bld 07/20/2018 139* 70 - 99 mg/dL Final   BUN 07/20/2018 11  8 - 23 mg/dL Final   Creatinine, Ser 07/20/2018 0.71  0.44 - 1.00 mg/dL Final   Calcium 07/20/2018  8.3* 8.9 - 10.3 mg/dL Final   GFR calc non Af Amer 07/20/2018 >60  >60 mL/min Final   GFR calc Af Amer 07/20/2018 >60  >60 mL/min Final   Anion gap 07/20/2018 8  5 - 15 Final   Performed at [facility], [place] [address] [place], [place] [address]  Hospital Outpatient Visit on 07/11/2018  Component Date Value Ref Range Status   aPTT 07/11/2018 28  24 - 36 seconds Final   Performed at [facility], [place] [address], [place], [place] [address]   WBC 07/11/2018 4.4  4.0 - 10.5 K/uL Final   RBC 07/11/2018 4.57  3.87 - 5.11 MIL/uL Final   Hemoglobin 07/11/2018 13.0  12.0 - 15.0 g/dL Final   HCT 07/11/2018 41.5  36.0 - 46.0 % Final   MCV 07/11/2018 90.8  80.0 - 100.0 fL Final   MCH 07/11/2018 28.4  26.0 - 34.0 pg Final   MCHC 07/11/2018 31.3  30.0 - 36.0 g/dL Final   RDW 07/11/2018 13.1  11.5 - 15.5 % Final   Platelets 07/11/2018 211  150 - 400 K/uL Final   nRBC 07/11/2018 0.0  0.0 - 0.2 % Final   Performed at [facility], [place] [address], [place], [place] [address]   Sodium 07/11/2018 142  135 - 145 mmol/L Final   Potassium 07/11/2018 4.4  3.5 - 5.1 mmol/L Final   Chloride 07/11/2018 107  98 - 111 mmol/L Final   CO2 07/11/2018 28  22 - 32 mmol/L Final   Glucose, Bld 07/11/2018 89  70 - 99 mg/dL Final   BUN 07/11/2018 21  8 - 23 mg/dL Final   Creatinine, Ser 07/11/2018 0.80  0.44 - 1.00 mg/dL Final   Calcium 07/11/2018 9.0  8.9 - 10.3 mg/dL Final   Total Protein 07/11/2018 7.3  6.5 - 8.1 g/dL Final   Albumin 07/11/2018 4.4  3.5 - 5.0 g/dL Final   AST 07/11/2018 20  15 - 41 U/L Final   ALT 07/11/2018 18  0 - 44 U/L Final   Alkaline Phosphatase 07/11/2018 77  38 - 126 U/L Final   Total Bilirubin 07/11/2018 0.8  0.3 - 1.2 mg/dL Final   GFR calc non Af Amer 07/11/2018 >60  >60 mL/min Final   GFR calc Af Amer 07/11/2018 >60  >60 mL/min Final   Anion gap 07/11/2018 7  5 - 15 Final   Performed at [facility], [place] [address] [place], [place] [address]   Prothrombin Time 07/11/2018 12.1  11.4 - 15.2 seconds Final   INR 07/11/2018 0.9  0.8 - 1.2 Final  Comment: (NOTE) INR goal varies based on device and disease states. Performed at [facility], [place] [address], [place], [place] [address]    ABO/RH(D) 07/11/2018 B POS   Final   Antibody Screen 07/11/2018 NEG   Final   Sample Expiration 07/11/2018 07/21/2018   Final   Extend sample reason 07/11/2018    Final                   Value:NO TRANSFUSIONS OR PREGNANCY IN THE PAST 3 MONTHS Performed at [place] [address], [place], [place] [address]    Color, Urine 07/11/2018 YELLOW  YELLOW Final   APPearance 07/11/2018 CLEAR  CLEAR Final   Specific Gravity, Urine 07/11/2018 1.020  1.005 - 1.030 Final   pH 07/11/2018 5.0  5.0 - 8.0 Final   Glucose, UA 07/11/2018 NEGATIVE  NEGATIVE mg/dL Final   Hgb urine dipstick 07/11/2018 NEGATIVE  NEGATIVE Final   Bilirubin Urine 07/11/2018 NEGATIVE  NEGATIVE Final   Ketones, ur 07/11/2018 NEGATIVE  NEGATIVE mg/dL Final   Protein, ur 07/11/2018 NEGATIVE  NEGATIVE mg/dL Final   Nitrite 07/11/2018 NEGATIVE  NEGATIVE Final   Leukocytes,Ua 07/11/2018 SMALL* NEGATIVE Final   RBC / HPF 07/11/2018 0-5  0 - 5 RBC/hpf Final   WBC, UA 07/11/2018 0-5  0 - 5 WBC/hpf Final   Bacteria, UA 07/11/2018 RARE* NONE SEEN Final   Squamous Epithelial / LPF 07/11/2018 0-5  0 - 5 Final   Mucus 07/11/2018 PRESENT   Final   Performed at [facility], [place] [address], [place], [place] [address]   MRSA, PCR 07/11/2018 NEGATIVE  NEGATIVE Final   Staphylococcus aureus 07/11/2018 NEGATIVE  NEGATIVE Final   Comment: (NOTE) The Xpert SA Assay (FDA approved for NASAL specimens in patients 43 years of age and older), is one component of a comprehensive surveillance program. It is not intended to diagnose infection nor to guide or  monitor treatment. Performed at [facility], [place] [address], [place], [place] [address]    ABO/RH(D) 07/11/2018    Final                   Value:B POS Performed at [facility], [place] [address] [place], [place] [address]       Hospital Course: [name] is a 68 y.o. who was admitted to [facility]. They were brought to the operating room on 07/18/2018 and underwent Procedure(s): TOTAL KNEE ARTHROPLASTY.  Patient tolerated the procedure well and was later transferred to the recovery room and then to the orthopaedic floor for postoperative care.  They were given PO and IV analgesics for pain control following their surgery.  They were given 24 hours of postoperative antibiotics of  Anti-infectives (From admission, onward)   Start     Dose/Rate Route Frequency Ordered Stop   07/18/18 1430  ceFAZolin (ANCEF) IVPB 1 g/50 mL premix     1 g 100 mL/hr over 30 Minutes Intravenous Every 6 hours 07/18/18 1147 07/18/18 2122   07/18/18 0956  polymyxin B 500,000 Units, bacitracin 50,000 Units in sodium chloride 0.9 % 500 mL irrigation  Status:  Discontinued       As needed 07/18/18 0956 07/18/18 1044   07/18/18 0615  ceFAZolin (ANCEF) IVPB 2g/100 mL premix     2 g 200 mL/hr over 30 Minutes Intravenous On call to O.R. 07/18/18 1324 07/18/18 0831     and started on DVT prophylaxis  in the form of Xarelto.   PT and OT were ordered for total joint protocol.  Discharge planning consulted to help with postop disposition and equipment needs.  Patient had a good night on the evening of surgery.  They started to get up OOB with therapy on day one. Hemovac drain was pulled without difficulty.  Continued to work with therapy into day two.  Dressing was changed on day two and the incision was clean and dry.  The patient had progressed with therapy and meeting their goals.  Incision was healing well.  Patient was seen in rounds and was ready to go home.   Diet:  Cardiac diet Activity:WBAT Follow-up:in 2 weeks Disposition - Home Discharged Condition: stable   Discharge Instructions    Call MD / Call 911   Complete by:  As directed    If you experience chest pain or shortness of breath, CALL 911 and be transported to the hospital emergency room.  If you develope a fever above 101 F, pus (white drainage) or increased drainage or redness at the wound, or calf pain, call your surgeon's office.   Constipation Prevention   Complete by:  As directed    Drink plenty of fluids.  Prune juice may be helpful.  You may use a stool softener, such as Colace (over the counter) 100 mg twice a day.  Use MiraLax (over the counter) for constipation as needed.   Diet - low sodium heart healthy   Complete by:  As directed    Discharge instructions   Complete by:  As directed    Dr. Gaynelle Arabian Total Joint Specialist [facility] [address], [place], [place] [address] [phone]  TOTAL KNEE REPLACEMENT POSTOPERATIVE DIRECTIONS  Knee Rehabilitation, Guidelines Following Surgery  Results after knee surgery are often greatly improved when you follow the exercise, range of motion and muscle strengthening exercises prescribed by your doctor. Safety measures are also important to protect the knee from further injury. Any time any of these exercises cause you to have increased pain or swelling in your knee joint, decrease the amount until you are comfortable again and slowly increase them. If you have problems or questions, call your caregiver or physical therapist for advice.   HOME CARE INSTRUCTIONS  Remove items at home which could result in a fall. This includes throw rugs or furniture in walking pathways.  ICE to the affected knee every three hours for 30 minutes at a time and then as needed for pain and swelling.  Continue to use ice on the knee for pain and swelling from surgery. You may notice swelling that will progress down to the foot and  ankle.  This is normal after surgery.  Elevate the leg when you are not up walking on it.   Continue to use the breathing machine which will help keep your temperature down.  It is common for your temperature to cycle up and down following surgery, especially at night when you are not up moving around and exerting yourself.  The breathing machine keeps your lungs expanded and your temperature down. Do not place pillow under knee, focus on keeping the knee straight while resting   DIET You may resume your previous home diet once your are discharged from the hospital.  DRESSING / WOUND CARE / SHOWERING You may shower 3 days after surgery, but keep the wounds dry during showering.  You may use an occlusive plastic wrap (Press'n Seal for example), NO SOAKING/SUBMERGING IN  THE BATHTUB.  If the bandage gets wet, change with a clean dry gauze.  If the incision gets wet, pat the wound dry with a clean towel. You may start showering once you are discharged home but do not submerge the incision under water. Just pat the incision dry and apply a dry gauze dressing on daily. Change the surgical dressing daily and reapply a dry dressing each time.  ACTIVITY Walk with your walker as instructed. Use walker as long as suggested by your caregivers. Avoid periods of inactivity such as sitting longer than an hour when not asleep. This helps prevent blood clots.  You may resume a sexual relationship in one month or when given the OK by your doctor.  You may return to work once you are cleared by your doctor.  Do not drive a car for 6 weeks or until released by you surgeon.  Do not drive while taking narcotics.  WEIGHT BEARING Weight bearing as tolerated with assist device (walker, cane, etc) as directed, use it as long as suggested by your surgeon or therapist, typically at least 4-6 weeks.  POSTOPERATIVE CONSTIPATION PROTOCOL Constipation - defined medically as fewer than three stools per week and severe  constipation as less than one stool per week.  One of the most common issues patients have following surgery is constipation.  Even if you have a regular bowel pattern at home, your normal regimen is likely to be disrupted due to multiple reasons following surgery.  Combination of anesthesia, postoperative narcotics, change in appetite and fluid intake all can affect your bowels.  In order to avoid complications following surgery, here are some recommendations in order to help you during your recovery period.  Colace (docusate) - Pick up an over-the-counter form of Colace or another stool softener and take twice a day as long as you are requiring postoperative pain medications.  Take with a full glass of water daily.  If you experience loose stools or diarrhea, hold the colace until you stool forms back up.  If your symptoms do not get better within 1 week or if they get worse, check with your doctor.  Dulcolax (bisacodyl) - Pick up over-the-counter and take as directed by the product packaging as needed to assist with the movement of your bowels.  Take with a full glass of water.  Use this product as needed if not relieved by Colace only.   MiraLax (polyethylene glycol) - Pick up over-the-counter to have on hand.  MiraLax is a solution that will increase the amount of water in your bowels to assist with bowel movements.  Take as directed and can mix with a glass of water, juice, soda, coffee, or tea.  Take if you go more than two days without a movement. Do not use MiraLax more than once per day. Call your doctor if you are still constipated or irregular after using this medication for 7 days in a row.  If you continue to have problems with postoperative constipation, please contact the office for further assistance and recommendations.  If you experience "the worst abdominal pain ever" or develop nausea or vomiting, please contact the office immediatly for further recommendations for  treatment.  ITCHING  If you experience itching with your medications, try taking only a single pain pill, or even half a pain pill at a time.  You can also use Benadryl over the counter for itching or also to help with sleep.   TED HOSE STOCKINGS Wear the elastic stockings on  both legs for three weeks following surgery during the day but you may remove then at night for sleeping.  MEDICATIONS See your medication summary on the "After Visit Summary" that the nursing staff will review with you prior to discharge.  You may have some home medications which will be placed on hold until you complete the course of blood thinner medication.  It is important for you to complete the blood thinner medication as prescribed by your surgeon.  Continue your approved medications as instructed at time of discharge.  PRECAUTIONS If you experience chest pain or shortness of breath - call 911 immediately for transfer to the hospital emergency department.  If you develop a fever greater that 101 F, purulent drainage from wound, increased redness or drainage from wound, foul odor from the wound/dressing, or calf pain - CONTACT YOUR SURGEON.                                                   FOLLOW-UP APPOINTMENTS Make sure you keep all of your appointments after your operation with your surgeon and caregivers. You should call the office at the above phone number and make an appointment for approximately two weeks after the date of your surgery or on the date instructed by your surgeon outlined in the "After Visit Summary".   RANGE OF MOTION AND STRENGTHENING EXERCISES  Rehabilitation of the knee is important following a knee injury or an operation. After just a few days of immobilization, the muscles of the thigh which control the knee become weakened and shrink (atrophy). Knee exercises are designed to build up the tone and strength of the thigh muscles and to improve knee motion. Often times heat used for twenty to  thirty minutes before working out will loosen up your tissues and help with improving the range of motion but do not use heat for the first two weeks following surgery. These exercises can be done on a training (exercise) mat, on the floor, on a table or on a bed. Use what ever works the best and is most comfortable for you Knee exercises include:  Leg Lifts - While your knee is still immobilized in a splint or cast, you can do straight leg raises. Lift the leg to 60 degrees, hold for 3 sec, and slowly lower the leg. Repeat 10-20 times 2-3 times daily. Perform this exercise against resistance later as your knee gets better.  Quad and Hamstring Sets - Tighten up the muscle on the front of the thigh (Quad) and hold for 5-10 sec. Repeat this 10-20 times hourly. Hamstring sets are done by pushing the foot backward against an object and holding for 5-10 sec. Repeat as with quad sets.  Leg Slides: Lying on your back, slowly slide your foot toward your buttocks, bending your knee up off the floor (only go as far as is comfortable). Then slowly slide your foot back down until your leg is flat on the floor again. Angel Wings: Lying on your back spread your legs to the side as far apart as you can without causing discomfort.  A rehabilitation program following serious knee injuries can speed recovery and prevent re-injury in the future due to weakened muscles. Contact your doctor or a physical therapist for more information on knee rehabilitation.   IF YOU ARE TRANSFERRED TO A SKILLED REHAB FACILITY If  the patient is transferred to a skilled rehab facility following release from the hospital, a list of the current medications will be sent to the facility for the patient to continue.  When discharged from the skilled rehab facility, please have the facility set up the patient's [place] prior to being released. Also, the skilled facility will be responsible for providing the patient with their  medications at time of release from the facility to include their pain medication, the muscle relaxants, and their blood thinner medication. If the patient is still at the rehab facility at time of the two week follow up appointment, the skilled rehab facility will also need to assist the patient in arranging follow up appointment in our office and any transportation needs.  MAKE SURE YOU:  Understand these instructions.  Get help right away if you are not doing well or get worse.    Pick up stool softner and laxative for home use following surgery while on pain medications. Do not submerge incision under water. Please use good hand washing techniques while changing dressing each day. May shower starting three days after surgery. Please use a clean towel to pat the incision dry following showers. Continue to use ice for pain and swelling after surgery. Do not use any lotions or creams on the incision until instructed by your surgeon.   Increase activity slowly as tolerated   Complete by:  As directed      Allergies as of 07/20/2018   No Known Allergies     Medication List    STOP taking these medications   ibuprofen 200 MG tablet Commonly known as:  ADVIL,MOTRIN   oxyCODONE-acetaminophen 5-325 MG tablet Commonly known as:  Roxicet     TAKE these medications   aspirin EC 325 MG tablet Take 1 tablet (325 mg total) by mouth 2 (two) times daily.   methocarbamol 500 MG tablet Commonly known as:  ROBAXIN Take 1 tablet (500 mg total) by mouth every 6 (six) hours as needed for muscle spasms. What changed:    when to take this  reasons to take this   multivitamin with minerals tablet Take 1 tablet by mouth daily.   oxyCODONE 5 MG immediate release tablet Commonly known as:  Oxy IR/ROXICODONE Take 1-2 tablets (5-10 mg total) by mouth every 6 (six) hours as needed for moderate pain (pain score 4-6).   pantoprazole 40 MG tablet Commonly known as:  PROTONIX Take 40 mg by mouth  daily.   PROBIOTIC DAILY PO Take 1 capsule by mouth daily.   rosuvastatin 20 MG tablet Commonly known as:  CRESTOR Take 20 mg by mouth daily.   sertraline 100 MG tablet Commonly known as:  ZOLOFT Take 100 mg by mouth daily.      Follow-up Information    [facility]. Go on 07/23/2018.   Why:  You are scheduled for a physical therapy appointment at [facility] on 07-23-2018 at 10:00 am Contact information: [address] [place], [place] [address] [phone]       [name], MD. Go on 07/31/2018.   Specialty:  Orthopedic Surgery Why:  You are scheduled for a post-operative appointment on 07-31-2018 at 9:45 am.  Contact information: [address] [place] [address] [phone]           Signed: Ardeen Jourdain, PA-C Orthopaedic Surgery 07/23/2018, 1:10 PM

## 2018-07-30 DIAGNOSIS — Z4789 Encounter for other orthopedic aftercare: Secondary | ICD-10-CM | POA: Diagnosis not present

## 2018-07-30 DIAGNOSIS — Z471 Aftercare following joint replacement surgery: Secondary | ICD-10-CM | POA: Diagnosis not present

## 2018-07-30 DIAGNOSIS — Z96659 Presence of unspecified artificial knee joint: Secondary | ICD-10-CM | POA: Diagnosis not present

## 2018-08-01 DIAGNOSIS — Z96659 Presence of unspecified artificial knee joint: Secondary | ICD-10-CM | POA: Diagnosis not present

## 2018-08-01 DIAGNOSIS — M25562 Pain in left knee: Secondary | ICD-10-CM | POA: Diagnosis not present

## 2018-08-07 DIAGNOSIS — M25562 Pain in left knee: Secondary | ICD-10-CM | POA: Diagnosis not present

## 2018-08-14 DIAGNOSIS — M25562 Pain in left knee: Secondary | ICD-10-CM | POA: Diagnosis not present

## 2018-08-15 DIAGNOSIS — M25562 Pain in left knee: Secondary | ICD-10-CM | POA: Insufficient documentation

## 2018-08-22 DIAGNOSIS — M25562 Pain in left knee: Secondary | ICD-10-CM | POA: Diagnosis not present

## 2018-08-29 DIAGNOSIS — M25562 Pain in left knee: Secondary | ICD-10-CM | POA: Diagnosis not present

## 2018-08-30 ENCOUNTER — Other Ambulatory Visit: Payer: Self-pay | Admitting: Surgical

## 2018-10-24 DIAGNOSIS — F419 Anxiety disorder, unspecified: Secondary | ICD-10-CM | POA: Diagnosis not present

## 2018-10-24 DIAGNOSIS — K219 Gastro-esophageal reflux disease without esophagitis: Secondary | ICD-10-CM | POA: Diagnosis not present

## 2018-10-24 DIAGNOSIS — E782 Mixed hyperlipidemia: Secondary | ICD-10-CM | POA: Diagnosis not present

## 2018-10-24 DIAGNOSIS — F325 Major depressive disorder, single episode, in full remission: Secondary | ICD-10-CM | POA: Diagnosis not present

## 2018-12-14 DIAGNOSIS — Z96652 Presence of left artificial knee joint: Secondary | ICD-10-CM | POA: Diagnosis not present

## 2018-12-14 DIAGNOSIS — Z471 Aftercare following joint replacement surgery: Secondary | ICD-10-CM | POA: Diagnosis not present

## 2019-01-17 ENCOUNTER — Other Ambulatory Visit: Payer: Self-pay | Admitting: Podiatry

## 2019-01-17 ENCOUNTER — Ambulatory Visit (INDEPENDENT_AMBULATORY_CARE_PROVIDER_SITE_OTHER): Payer: Medicare Other | Admitting: Podiatry

## 2019-01-17 ENCOUNTER — Ambulatory Visit (INDEPENDENT_AMBULATORY_CARE_PROVIDER_SITE_OTHER): Payer: Medicare Other

## 2019-01-17 ENCOUNTER — Encounter: Payer: Self-pay | Admitting: Podiatry

## 2019-01-17 VITALS — BP 132/65 | HR 70 | Resp 16

## 2019-01-17 DIAGNOSIS — M7752 Other enthesopathy of left foot: Secondary | ICD-10-CM

## 2019-01-17 DIAGNOSIS — M7662 Achilles tendinitis, left leg: Secondary | ICD-10-CM

## 2019-01-17 DIAGNOSIS — M722 Plantar fascial fibromatosis: Secondary | ICD-10-CM | POA: Diagnosis not present

## 2019-01-17 MED ORDER — METHYLPREDNISOLONE 4 MG PO TBPK: ORAL_TABLET | ORAL | 0 refills | Status: DC

## 2019-01-17 NOTE — Progress Notes (Signed)
Subjective:  Patient ID: [name], female    DOB: [birth year],  MRN: [id number] HPI Chief Complaint  Patient presents with  . Foot Pain    Posterior (mainly) and plantar heel left - stepped on a rock camping 3 weeks ago, had pain since, previous heel surgery by Dr. Prudence Davidson (1980's), taking Advil daily  . New Patient (Initial Visit)    68 y.o. female presents with the above complaint.   ROS: Denies fever chills nausea vomiting muscle aches pains calf pain back pain chest pain shortness of breath.  Past Medical History:  Diagnosis Date  . Anxiety   . Arthritis   . GERD (gastroesophageal reflux disease)   . Pneumonia    Past Surgical History:  Procedure Laterality Date  . CESAREAN SECTION     x2   . FINGER SURGERY     rt little finger trigger   . FOOT SURGERY     right   foot  tendon  . PLANTAR FASCIA RELEASE     left  . SHOULDER ARTHROSCOPY WITH SUBACROMIAL DECOMPRESSION AND BICEP TENDON REPAIR Left 06/12/2015   Procedure: LEFT SHOULDER ARTHROSCOPY WITH DEBRIDEMENT OF SLAP, SUBACROMIAL DECOMPRESSION AND BICEP TENODESIS;  Surgeon: Netta Cedars, MD;  Location: [facility];  Service: Orthopedics;  Laterality: Left;  . TOTAL KNEE ARTHROPLASTY Left 07/18/2018   Procedure: TOTAL KNEE ARTHROPLASTY;  Surgeon: [name], MD;  Location: [facility];  Service: Orthopedics;  Laterality: Left;  174min    Current Outpatient Medications:  .  methylPREDNISolone (MEDROL DOSEPAK) 4 MG TBPK tablet, 6 day dose pack - take as directed, Disp: 21 tablet, Rfl: 0 .  rosuvastatin (CRESTOR) 20 MG tablet, Take 20 mg by mouth daily., Disp: , Rfl:  .  sertraline (ZOLOFT) 100 MG tablet, Take 100 mg by mouth daily., Disp: , Rfl:   Allergies  Allergen Reactions  . Cepacol  [Benzocaine-Menthol] Nausea Only   Review of Systems Objective:   Vitals:   01/17/19 1437  BP: 132/65  Pulse: 70  Resp: 16    General: Well developed, nourished, in no acute distress, alert and oriented x3   Dermatological:  Skin is warm, dry and supple bilateral. Nails x 10 are well maintained; remaining integument appears unremarkable at this time. There are no open sores, no preulcerative lesions, no rash or signs of infection present.  Vascular: Dorsalis Pedis artery and Posterior Tibial artery pedal pulses are 2/4 bilateral with immedate capillary fill time. Pedal hair growth present. No varicosities and no lower extremity edema present bilateral.   Neruologic: Grossly intact via light touch bilateral. Vibratory intact via tuning fork bilateral. Protective threshold with Semmes Wienstein monofilament intact to all pedal sites bilateral. Patellar and Achilles deep tendon reflexes 2+ bilateral. No Babinski or clonus noted bilateral.   Musculoskeletal: No gross boney pedal deformities bilateral. No pain, crepitus, or limitation noted with foot and ankle range of motion bilateral. Muscular strength 5/5 in all groups tested bilateral.  Moderate to severe pain on palpation of the distal Achilles with warmth on palpation.  Plantar fascia is exquisitely tender on palpation medial calcaneal tubercle of the left heel.  Gait: Unassisted, Nonantalgic.    Radiographs:  Demonstrates posterior and plantar calcaneal spurs with calcification within the Achilles tendon distally as well as the plantar fascia proximally.  No acute findings noted no fractures are identified.  Assessment & Plan:   Assessment: Plantar fasciitis left.  Achilles tendinitis with bursitis left.  Plan: Injected the left heel with 20 mg of Kenalog  5 mg of Marcaine plantarly injected the left posterior heel with the bursitis making sure not to inject into the tendon itself with 5 mg of Kenalog 5 mg of Marcaine.  Start her on a Medrol Dosepak placed her and a cam walker and I will follow-up with her in 1 month     Josetta Wigal T. [place], [place]

## 2019-01-18 ENCOUNTER — Telehealth: Payer: Self-pay | Admitting: *Deleted

## 2019-01-18 NOTE — Telephone Encounter (Signed)
I called pt and informed that the boot was given to support the structures of the foot and allow to rest, by pt just being up and walking to perform ADL, not touring or shopping. I told pt she had 2 options, one wear the boot when up performing ADL, then if going to be up a longer period of time wear her supportive shoe, or she could wear the boot when resting to hold the plantar fascia stretched so she didn't get the abrupt stretching tearing sensation that occurred when the fascia shortened when relaxed the abruptly stretched when standing. Pt states she will give the options a try.

## 2019-01-18 NOTE — Telephone Encounter (Signed)
Pt states she was seen yesterday with Dr. Milinda Pointer and given a boot for plantar fasciitis and tendonitis, but she can't wear the heavy boot due to a knee problem and is going to wear a flat shoe with support.

## 2019-02-06 DIAGNOSIS — Z803 Family history of malignant neoplasm of breast: Secondary | ICD-10-CM | POA: Diagnosis not present

## 2019-02-06 DIAGNOSIS — Z1231 Encounter for screening mammogram for malignant neoplasm of breast: Secondary | ICD-10-CM | POA: Diagnosis not present

## 2019-02-07 DIAGNOSIS — Z23 Encounter for immunization: Secondary | ICD-10-CM | POA: Diagnosis not present

## 2019-02-14 ENCOUNTER — Ambulatory Visit: Payer: Medicare Other | Admitting: Podiatry

## 2019-05-10 ENCOUNTER — Ambulatory Visit
Admission: RE | Admit: 2019-05-10 | Discharge: 2019-05-10 | Disposition: A | Discharge status: Home / Self Care | Payer: Medicare Other | Source: Ambulatory Visit | Attending: Family Medicine | Admitting: Family Medicine

## 2019-05-10 ENCOUNTER — Other Ambulatory Visit: Payer: Self-pay | Admitting: Family Medicine

## 2019-05-10 ENCOUNTER — Other Ambulatory Visit: Payer: Self-pay

## 2019-05-10 DIAGNOSIS — R0781 Pleurodynia: Secondary | ICD-10-CM | POA: Diagnosis not present

## 2019-05-10 DIAGNOSIS — R109 Unspecified abdominal pain: Secondary | ICD-10-CM | POA: Diagnosis not present

## 2019-06-28 DIAGNOSIS — Z23 Encounter for immunization: Secondary | ICD-10-CM | POA: Diagnosis not present

## 2019-07-15 DIAGNOSIS — M545 Low back pain: Secondary | ICD-10-CM | POA: Diagnosis not present

## 2019-07-16 ENCOUNTER — Other Ambulatory Visit: Payer: Self-pay | Admitting: Orthopedic Surgery

## 2019-07-16 DIAGNOSIS — M545 Low back pain, unspecified: Secondary | ICD-10-CM

## 2019-07-17 DIAGNOSIS — M545 Low back pain: Secondary | ICD-10-CM | POA: Diagnosis not present

## 2019-07-19 ENCOUNTER — Ambulatory Visit
Admission: RE | Admit: 2019-07-19 | Discharge: 2019-07-19 | Disposition: A | Discharge status: Home / Self Care | Payer: Medicare Other | Source: Ambulatory Visit | Attending: Orthopedic Surgery | Admitting: Orthopedic Surgery

## 2019-07-19 DIAGNOSIS — R109 Unspecified abdominal pain: Secondary | ICD-10-CM | POA: Diagnosis not present

## 2019-07-19 DIAGNOSIS — M545 Low back pain, unspecified: Secondary | ICD-10-CM

## 2019-07-19 MED ORDER — IOPAMIDOL (ISOVUE-300) INJECTION 61%
100.0000 mL | Freq: Once | INTRAVENOUS | Status: AC | PRN
  Administered 2019-07-19: 100 mL via INTRAVENOUS

## 2019-07-22 DIAGNOSIS — M545 Low back pain: Secondary | ICD-10-CM | POA: Diagnosis not present

## 2019-07-22 DIAGNOSIS — R822 Biliuria: Secondary | ICD-10-CM | POA: Diagnosis not present

## 2019-07-22 DIAGNOSIS — M62838 Other muscle spasm: Secondary | ICD-10-CM | POA: Diagnosis not present

## 2019-07-26 DIAGNOSIS — M545 Low back pain: Secondary | ICD-10-CM | POA: Diagnosis not present

## 2019-07-26 DIAGNOSIS — Z23 Encounter for immunization: Secondary | ICD-10-CM | POA: Diagnosis not present

## 2019-08-30 DIAGNOSIS — E782 Mixed hyperlipidemia: Secondary | ICD-10-CM | POA: Diagnosis not present

## 2019-08-30 DIAGNOSIS — D649 Anemia, unspecified: Secondary | ICD-10-CM | POA: Diagnosis not present

## 2019-08-30 DIAGNOSIS — F329 Major depressive disorder, single episode, unspecified: Secondary | ICD-10-CM | POA: Diagnosis not present

## 2019-08-30 DIAGNOSIS — F325 Major depressive disorder, single episode, in full remission: Secondary | ICD-10-CM | POA: Diagnosis not present

## 2019-08-30 DIAGNOSIS — G4700 Insomnia, unspecified: Secondary | ICD-10-CM | POA: Diagnosis not present

## 2019-08-30 DIAGNOSIS — H35033 Hypertensive retinopathy, bilateral: Secondary | ICD-10-CM | POA: Diagnosis not present

## 2019-09-10 DIAGNOSIS — M545 Low back pain, unspecified: Secondary | ICD-10-CM | POA: Insufficient documentation

## 2019-09-17 DIAGNOSIS — M545 Low back pain: Secondary | ICD-10-CM | POA: Diagnosis not present

## 2019-09-23 DIAGNOSIS — M545 Low back pain: Secondary | ICD-10-CM | POA: Diagnosis not present

## 2019-10-07 DIAGNOSIS — M5459 Other low back pain: Secondary | ICD-10-CM | POA: Insufficient documentation

## 2019-10-16 DIAGNOSIS — M545 Low back pain: Secondary | ICD-10-CM | POA: Diagnosis not present

## 2019-10-17 DIAGNOSIS — E782 Mixed hyperlipidemia: Secondary | ICD-10-CM | POA: Diagnosis not present

## 2019-10-17 DIAGNOSIS — K219 Gastro-esophageal reflux disease without esophagitis: Secondary | ICD-10-CM | POA: Diagnosis not present

## 2019-10-17 DIAGNOSIS — R7303 Prediabetes: Secondary | ICD-10-CM | POA: Diagnosis not present

## 2019-10-17 DIAGNOSIS — F325 Major depressive disorder, single episode, in full remission: Secondary | ICD-10-CM | POA: Diagnosis not present

## 2019-11-15 DIAGNOSIS — D649 Anemia, unspecified: Secondary | ICD-10-CM | POA: Diagnosis not present

## 2019-11-15 DIAGNOSIS — G4700 Insomnia, unspecified: Secondary | ICD-10-CM | POA: Diagnosis not present

## 2019-11-15 DIAGNOSIS — F329 Major depressive disorder, single episode, unspecified: Secondary | ICD-10-CM | POA: Diagnosis not present

## 2019-11-15 DIAGNOSIS — E782 Mixed hyperlipidemia: Secondary | ICD-10-CM | POA: Diagnosis not present

## 2019-11-15 DIAGNOSIS — F325 Major depressive disorder, single episode, in full remission: Secondary | ICD-10-CM | POA: Diagnosis not present

## 2019-11-15 DIAGNOSIS — H35033 Hypertensive retinopathy, bilateral: Secondary | ICD-10-CM | POA: Diagnosis not present

## 2019-11-28 DIAGNOSIS — R079 Chest pain, unspecified: Secondary | ICD-10-CM | POA: Diagnosis not present

## 2019-11-28 DIAGNOSIS — R0789 Other chest pain: Secondary | ICD-10-CM | POA: Diagnosis not present

## 2019-11-28 DIAGNOSIS — K219 Gastro-esophageal reflux disease without esophagitis: Secondary | ICD-10-CM | POA: Diagnosis not present

## 2019-12-19 DIAGNOSIS — G4700 Insomnia, unspecified: Secondary | ICD-10-CM | POA: Diagnosis not present

## 2019-12-19 DIAGNOSIS — D649 Anemia, unspecified: Secondary | ICD-10-CM | POA: Diagnosis not present

## 2019-12-19 DIAGNOSIS — F325 Major depressive disorder, single episode, in full remission: Secondary | ICD-10-CM | POA: Diagnosis not present

## 2019-12-19 DIAGNOSIS — E782 Mixed hyperlipidemia: Secondary | ICD-10-CM | POA: Diagnosis not present

## 2019-12-19 DIAGNOSIS — F329 Major depressive disorder, single episode, unspecified: Secondary | ICD-10-CM | POA: Diagnosis not present

## 2019-12-19 DIAGNOSIS — H35033 Hypertensive retinopathy, bilateral: Secondary | ICD-10-CM | POA: Diagnosis not present

## 2019-12-30 DIAGNOSIS — R519 Headache, unspecified: Secondary | ICD-10-CM | POA: Diagnosis not present

## 2019-12-30 DIAGNOSIS — H04123 Dry eye syndrome of bilateral lacrimal glands: Secondary | ICD-10-CM | POA: Diagnosis not present

## 2020-02-07 DIAGNOSIS — F325 Major depressive disorder, single episode, in full remission: Secondary | ICD-10-CM | POA: Diagnosis not present

## 2020-02-07 DIAGNOSIS — H35033 Hypertensive retinopathy, bilateral: Secondary | ICD-10-CM | POA: Diagnosis not present

## 2020-02-07 DIAGNOSIS — D649 Anemia, unspecified: Secondary | ICD-10-CM | POA: Diagnosis not present

## 2020-02-07 DIAGNOSIS — F329 Major depressive disorder, single episode, unspecified: Secondary | ICD-10-CM | POA: Diagnosis not present

## 2020-02-07 DIAGNOSIS — E782 Mixed hyperlipidemia: Secondary | ICD-10-CM | POA: Diagnosis not present

## 2020-02-07 DIAGNOSIS — Z23 Encounter for immunization: Secondary | ICD-10-CM | POA: Diagnosis not present

## 2020-02-07 DIAGNOSIS — G4700 Insomnia, unspecified: Secondary | ICD-10-CM | POA: Diagnosis not present

## 2020-02-12 DIAGNOSIS — Z1231 Encounter for screening mammogram for malignant neoplasm of breast: Secondary | ICD-10-CM | POA: Diagnosis not present

## 2020-02-20 DIAGNOSIS — Z23 Encounter for immunization: Secondary | ICD-10-CM | POA: Diagnosis not present

## 2020-03-18 DIAGNOSIS — M25561 Pain in right knee: Secondary | ICD-10-CM | POA: Insufficient documentation

## 2020-03-20 DIAGNOSIS — M25561 Pain in right knee: Secondary | ICD-10-CM | POA: Diagnosis not present

## 2020-03-20 DIAGNOSIS — Z96659 Presence of unspecified artificial knee joint: Secondary | ICD-10-CM | POA: Diagnosis not present

## 2020-03-20 DIAGNOSIS — M1711 Unilateral primary osteoarthritis, right knee: Secondary | ICD-10-CM | POA: Diagnosis not present

## 2020-04-01 DIAGNOSIS — R202 Paresthesia of skin: Secondary | ICD-10-CM | POA: Diagnosis not present

## 2020-04-01 DIAGNOSIS — Z79899 Other long term (current) drug therapy: Secondary | ICD-10-CM | POA: Diagnosis not present

## 2020-04-01 DIAGNOSIS — M8588 Other specified disorders of bone density and structure, other site: Secondary | ICD-10-CM | POA: Diagnosis not present

## 2020-04-01 DIAGNOSIS — R7303 Prediabetes: Secondary | ICD-10-CM | POA: Diagnosis not present

## 2020-04-07 DIAGNOSIS — R202 Paresthesia of skin: Secondary | ICD-10-CM | POA: Diagnosis not present

## 2020-04-30 DIAGNOSIS — H04123 Dry eye syndrome of bilateral lacrimal glands: Secondary | ICD-10-CM | POA: Diagnosis not present

## 2020-04-30 DIAGNOSIS — H5213 Myopia, bilateral: Secondary | ICD-10-CM | POA: Diagnosis not present

## 2020-04-30 DIAGNOSIS — H2513 Age-related nuclear cataract, bilateral: Secondary | ICD-10-CM | POA: Diagnosis not present

## 2020-04-30 DIAGNOSIS — H35033 Hypertensive retinopathy, bilateral: Secondary | ICD-10-CM | POA: Diagnosis not present

## 2020-04-30 DIAGNOSIS — H25013 Cortical age-related cataract, bilateral: Secondary | ICD-10-CM | POA: Diagnosis not present

## 2020-05-14 DIAGNOSIS — F325 Major depressive disorder, single episode, in full remission: Secondary | ICD-10-CM | POA: Diagnosis not present

## 2020-05-14 DIAGNOSIS — R7309 Other abnormal glucose: Secondary | ICD-10-CM | POA: Diagnosis not present

## 2020-05-14 DIAGNOSIS — Z1389 Encounter for screening for other disorder: Secondary | ICD-10-CM | POA: Diagnosis not present

## 2020-05-14 DIAGNOSIS — Z0000 Encounter for general adult medical examination without abnormal findings: Secondary | ICD-10-CM | POA: Diagnosis not present

## 2020-05-14 DIAGNOSIS — Z01818 Encounter for other preprocedural examination: Secondary | ICD-10-CM | POA: Diagnosis not present

## 2020-05-14 DIAGNOSIS — F419 Anxiety disorder, unspecified: Secondary | ICD-10-CM | POA: Diagnosis not present

## 2020-05-14 DIAGNOSIS — E782 Mixed hyperlipidemia: Secondary | ICD-10-CM | POA: Diagnosis not present

## 2020-05-15 ENCOUNTER — Other Ambulatory Visit: Payer: Self-pay

## 2020-05-15 ENCOUNTER — Encounter (HOSPITAL_COMMUNITY): Payer: Self-pay

## 2020-05-15 DIAGNOSIS — Z01812 Encounter for preprocedural laboratory examination: Secondary | ICD-10-CM | POA: Diagnosis not present

## 2020-05-15 NOTE — Progress Notes (Signed)
COVID Vaccine Completed: Yes Date COVID Vaccine completed: 02/20/20 [place] COVID vaccine manufacturer: Pfizer     PCP - Dr. Jeremy Johann Cardiologist -   Chest x-ray - 05/10/19 EKG -  Stress Test -  ECHO -  Cardiac Cath -  Pacemaker/ICD device last checked:  Sleep Study -  CPAP -   Fasting Blood Sugar -  Checks Blood Sugar _____ times a day  Blood Thinner Instructions: Aspirin Instructions: Last Dose:  Anesthesia review:   Patient denies shortness of breath, fever, cough and chest pain at PAT appointment   Patient verbalized understanding of instructions that were given to them at the PAT appointment. Patient was also instructed that they will need to review over the PAT instructions again at home before surgery.

## 2020-05-15 NOTE — Patient Instructions (Addendum)
DUE TO COVID-19 ONLY ONE VISITOR IS ALLOWED TO COME WITH YOU AND STAY IN THE WAITING ROOM ONLY DURING PRE OP AND PROCEDURE DAY OF SURGERY. THE 1 VISITOR  MAY VISIT WITH YOU AFTER SURGERY IN YOUR PRIVATE ROOM DURING VISITING HOURS ONLY!  YOU NEED TO HAVE A COVID 19 TEST ON: 05/21/20 @  12:30 PM , THIS TEST MUST BE DONE BEFORE SURGERY,  COVID TESTING SITE [place] [address], IT IS ON THE RIGHT GOING OUT [address] APPROXIMATELY  2 MINUTES PAST [employer] ON THE RIGHT. ONCE YOUR COVID TEST IS COMPLETED,  PLEASE BEGIN THE QUARANTINE INSTRUCTIONS AS OUTLINED IN YOUR HANDOUT.                [place] [place]    Your procedure is scheduled on: 05/25/20   Report to [facility] Main  Entrance   Report to admitting at: 8:00 AM     Call this number if you have problems the morning of surgery [phone]    Remember:   NO SOLID FOOD AFTER MIDNIGHT THE NIGHT PRIOR TO SURGERY. NOTHING BY MOUTH EXCEPT CLEAR LIQUIDS UNTIL: 7:30 AM . PLEASE FINISH ENSURE DRINK PER SURGEON ORDER  WHICH NEEDS TO BE COMPLETED AT: 7:30 AM .  CLEAR LIQUID DIET   Foods Allowed                                                                     Foods Excluded  Coffee and tea, regular and decaf                             liquids that you cannot  Plain Jell-O any favor except red or purple                                           see through such as: Fruit ices (not with fruit pulp)                                     milk, soups, orange juice  Iced Popsicles                                    All solid food Carbonated beverages, regular and diet                                    Cranberry, grape and apple juices Sports drinks like Gatorade Lightly seasoned clear broth or consume(fat free) Sugar, honey syrup  Sample Menu Breakfast                                Lunch  Supper Cranberry juice                    Beef broth                             Chicken broth Jell-O                                     Grape juice                           Apple juice Coffee or tea                        Jell-O                                      Popsicle                                                Coffee or tea                        Coffee or tea  _____________________________________________________________________   BRUSH YOUR TEETH MORNING OF SURGERY AND RINSE YOUR MOUTH OUT, NO CHEWING GUM CANDY OR MINTS.    Take these medicines the morning of surgery with A SIP OF WATER: pantoprazole,sertraline.                                You may not have any metal on your body including hair pins and              piercings  Do not wear jewelry, make-up, lotions, powders or perfumes, deodorant             Do not wear nail polish on your fingernails.  Do not shave  48 hours prior to surgery.            Do not bring valuables to the hospital. [place].  Contacts, dentures or bridgework may not be worn into surgery.  Leave suitcase in the car. After surgery it may be brought to your room.     Patients discharged the day of surgery will not be allowed to drive home. IF YOU ARE HAVING SURGERY AND GOING HOME THE SAME DAY, YOU MUST HAVE AN ADULT TO DRIVE YOU HOME AND BE WITH YOU FOR 24 HOURS. YOU MAY GO HOME BY TAXI OR UBER OR ORTHERWISE, BUT AN ADULT MUST ACCOMPANY YOU HOME AND STAY WITH YOU FOR 24 HOURS.  Name and phone number of your driver:  Special Instructions: N/A              Please read over the following fact sheets you were given: _____________________________________________________________________         [facility] - Preparing for Surgery Before surgery, you can play an important role.  Because skin is not sterile, your skin needs to be  as free of germs as possible.  You can reduce the number of germs on your skin by washing with CHG (chlorahexidine gluconate) soap before surgery.   CHG is an antiseptic cleaner which kills germs and bonds with the skin to continue killing germs even after washing. Please DO NOT use if you have an allergy to CHG or antibacterial soaps.  If your skin becomes reddened/irritated stop using the CHG and inform your nurse when you arrive at Short Stay. Do not shave (including legs and underarms) for at least 48 hours prior to the first CHG shower.  You may shave your face/neck. Please follow these instructions carefully:  1.  Shower with CHG Soap the night before surgery and the  morning of Surgery.  2.  If you choose to wash your hair, wash your hair first as usual with your  normal  shampoo.  3.  After you shampoo, rinse your hair and body thoroughly to remove the  shampoo.                           4.  Use CHG as you would any other liquid soap.  You can apply chg directly  to the skin and wash                       Gently with a scrungie or clean washcloth.  5.  Apply the CHG Soap to your body ONLY FROM THE NECK DOWN.   Do not use on face/ open                           Wound or open sores. Avoid contact with eyes, ears mouth and genitals (private parts).                       Wash face,  Genitals (private parts) with your normal soap.             6.  Wash thoroughly, paying special attention to the area where your surgery  will be performed.  7.  Thoroughly rinse your body with warm water from the neck down.  8.  DO NOT shower/wash with your normal soap after using and rinsing off  the CHG Soap.                9.  Pat yourself dry with a clean towel.            10.  Wear clean pajamas.            11.  Place clean sheets on your bed the night of your first shower and do not  sleep with pets. Day of Surgery : Do not apply any lotions/deodorants the morning of surgery.  Please wear clean clothes to the hospital/surgery center.  FAILURE TO FOLLOW THESE INSTRUCTIONS MAY RESULT IN THE CANCELLATION OF YOUR SURGERY PATIENT  SIGNATURE_________________________________  NURSE SIGNATURE__________________________________  ________________________________________________________________________   Adam Phenix  An incentive spirometer is a tool that can help keep your lungs clear and active. This tool measures how well you are filling your lungs with each breath. Taking long deep breaths may help reverse or decrease the chance of developing breathing (pulmonary) problems (especially infection) following:  A long period of time when you are unable to move or be active. BEFORE THE PROCEDURE   If the spirometer includes an indicator to show your best  effort, your nurse or respiratory therapist will set it to a desired goal.  If possible, sit up straight or lean slightly forward. Try not to slouch.  Hold the incentive spirometer in an upright position. INSTRUCTIONS FOR USE  1. Sit on the edge of your bed if possible, or sit up as far as you can in bed or on a chair. 2. Hold the incentive spirometer in an upright position. 3. Breathe out normally. 4. Place the mouthpiece in your mouth and seal your lips tightly around it. 5. Breathe in slowly and as deeply as possible, raising the piston or the ball toward the top of the column. 6. Hold your breath for 3-5 seconds or for as long as possible. Allow the piston or ball to fall to the bottom of the column. 7. Remove the mouthpiece from your mouth and breathe out normally. 8. Rest for a few seconds and repeat Steps 1 through 7 at least 10 times every 1-2 hours when you are awake. Take your time and take a few normal breaths between deep breaths. 9. The spirometer may include an indicator to show your best effort. Use the indicator as a goal to work toward during each repetition. 10. After each set of 10 deep breaths, practice coughing to be sure your lungs are clear. If you have an incision (the cut made at the time of surgery), support your incision when coughing  by placing a pillow or rolled up towels firmly against it. Once you are able to get out of bed, walk around indoors and cough well. You may stop using the incentive spirometer when instructed by your caregiver.  RISKS AND COMPLICATIONS  Take your time so you do not get dizzy or light-headed.  If you are in pain, you may need to take or ask for pain medication before doing incentive spirometry. It is harder to take a deep breath if you are having pain. AFTER USE  Rest and breathe slowly and easily.  It can be helpful to keep track of a log of your progress. Your caregiver can provide you with a simple table to help with this. If you are using the spirometer at home, follow these instructions: [place] IF:   You are having difficultly using the spirometer.  You have trouble using the spirometer as often as instructed.  Your pain medication is not giving enough relief while using the spirometer.  You develop fever of 100.5 F (38.1 C) or higher. SEEK IMMEDIATE MEDICAL CARE IF:   You cough up bloody sputum that had not been present before.  You develop fever of 102 F (38.9 C) or greater.  You develop worsening pain at or near the incision site. MAKE SURE YOU:   Understand these instructions.  Will watch your condition.  Will get help right away if you are not doing well or get worse. Document Released: 08/29/2006 Document Revised: 07/11/2011 Document Reviewed: 10/30/2006 [facility] Patient Information 2014 [place], [place].   ________________________________________________________________________

## 2020-05-18 ENCOUNTER — Encounter (HOSPITAL_COMMUNITY)
Admission: RE | Admit: 2020-05-18 | Discharge: 2020-05-18 | Disposition: A | Discharge status: Home / Self Care | Payer: Medicare Other | Source: Ambulatory Visit | Attending: Orthopedic Surgery | Admitting: Orthopedic Surgery

## 2020-05-18 DIAGNOSIS — Z01812 Encounter for preprocedural laboratory examination: Secondary | ICD-10-CM | POA: Insufficient documentation

## 2020-05-18 HISTORY — DX: Prediabetes: R73.03

## 2020-05-20 NOTE — H&P (Signed)
TOTAL KNEE ADMISSION H&P  Patient is being admitted for right total knee arthroplasty.  Subjective:  Chief Complaint: Right knee pain.  HPI: [name], 70 y.o. female has a history of pain and functional disability in the right knee due to arthritis and has failed non-surgical conservative treatments for greater than 12 weeks to include NSAID's and/or analgesics and activity modification. Onset of symptoms was gradual, starting several years ago with gradually worsening course since that time. The patient noted no past surgery on the right knee.  Patient currently rates pain in the right knee at 5 out of 10 with activity. AP and lateral of the right knee dated 03/20/2020 demonstrated severe bone-on-bone arthritis in the medial and patellofemoral compartments with large osteophytes. There is no active infection.  Patient Active Problem List   Diagnosis Date Noted  . Pain in left knee 08/15/2018  . H/O total knee replacement, left 07/18/2018  . Trigger thumb of left hand 04/23/2018  . Aftercare 11/09/2017  . Radial styloid tenosynovitis of left hand 09/07/2017  . Osteoarthritis of left knee 08/03/2017    Past Medical History:  Diagnosis Date  . Anxiety   . Arthritis   . GERD (gastroesophageal reflux disease)   . Pneumonia   . Pre-diabetes     Past Surgical History:  Procedure Laterality Date  . CESAREAN SECTION     x2   . FINGER SURGERY     rt little finger trigger   . FOOT SURGERY     right   foot  tendon  . PLANTAR FASCIA RELEASE     left  . SHOULDER ARTHROSCOPY WITH SUBACROMIAL DECOMPRESSION AND BICEP TENDON REPAIR Left 06/12/2015   Procedure: LEFT SHOULDER ARTHROSCOPY WITH DEBRIDEMENT OF SLAP, SUBACROMIAL DECOMPRESSION AND BICEP TENODESIS;  Surgeon: Netta Cedars, MD;  Location: [facility];  Service: Orthopedics;  Laterality: Left;  . TOTAL KNEE ARTHROPLASTY Left 07/18/2018   Procedure: TOTAL KNEE ARTHROPLASTY;  Surgeon: [name], MD;  Location: [facility];  Service:  Orthopedics;  Laterality: Left;  116min    Prior to Admission medications   Medication Sig Start Date End Date Taking? Authorizing Provider  meloxicam (MOBIC) 15 MG tablet Take 15 mg by mouth daily.   Yes [provider]  Multiple Vitamins-Minerals (MULTIVITAMIN WITH MINERALS) tablet Take 1 tablet by mouth daily.   Yes [provider]  pantoprazole (PROTONIX) 40 MG tablet Take 40 mg by mouth daily.   Yes [provider]  Probiotic Product (PROBIOTIC DAILY PO) Take 1 capsule by mouth daily.   Yes [provider]  rosuvastatin (CRESTOR) 20 MG tablet Take 20 mg by mouth daily.   Yes [provider]  sertraline (ZOLOFT) 100 MG tablet Take 100 mg by mouth in the morning and at bedtime.   Yes [provider]    Allergies  Allergen Reactions  . Cepacol  [Benzocaine-Menthol] Nausea Only    Social History   Socioeconomic History  . Marital status: Married    Spouse name: Not on file  . Number of children: Not on file  . Years of education: Not on file  . Highest education level: Not on file  Occupational History  . Not on file  Tobacco Use  . Smoking status: Never Smoker  . Smokeless tobacco: Never Used  Vaping Use  . Vaping Use: Never used  Substance and Sexual Activity  . Alcohol use: No  . Drug use: No  . Sexual activity: Not Currently  Other Topics Concern  .  Not on file  Social History Narrative  . Not on file   Social Determinants of Health   Financial Resource Strain: Not on file  Food Insecurity: Not on file  Transportation Needs: Not on file  Physical Activity: Not on file  Stress: Not on file  Social Connections: Not on file  Intimate Partner Violence: Not on file    Tobacco Use: Low Risk   . Smoking Tobacco Use: Never Smoker  . Smokeless Tobacco Use: Never Used   Social History   Substance and Sexual Activity  Alcohol Use No    No family history on file.  Review of Systems  Constitutional:  Negative for chills and fever.  Respiratory: Negative for cough and shortness of breath.   Cardiovascular: Negative for chest pain and palpitations.  Musculoskeletal: Positive for joint pain.  Skin: Negative for itching and rash.  Neurological: Negative for tingling and weakness.    Objective:  Physical Exam: Antalgic gait pattern favoring the right side without the use of assisted devices.    Right Knee Exam:  No effusion.  Range of motion is 5 to 115 degrees.  Marked crepitus on range of motion of the knee.  Positive medial joint line tenderness.  No lateral joint line tenderness.  Stable knee.    Left Knee Exam:  Well-healed scar from previous arthroplasty.  No effusion.  Range of motion is 0 to 110 degrees.  No crepitus on range of motion of the knee.  No medial joint line tenderness.  No lateral joint line tenderness.  The knee is stable.    Sensation and motor function intact in LE. Distal pulses 2+. Calves soft and nontender.  Vital signs in last 24 hours:    Imaging Review AP and lateral of the bilateral knees dated 03/20/2020 demonstrate the prosthesis on the left is in excellent position with no periprosthetic abnormalities. On the right, she has severe bone-on-bone arthritis in the medial and patellofemoral compartments with large osteophytes.  Assessment/Plan:  End stage arthritis, right knee   The patient history, physical examination, clinical judgment of the provider and imaging studies are consistent with end stage degenerative joint disease of the right knee and total knee arthroplasty is deemed medically necessary. The treatment options including medical management, injection therapy arthroscopy and arthroplasty were discussed at length. The risks and benefits of total knee arthroplasty were presented and reviewed. The risks due to aseptic loosening, infection, stiffness, patella tracking problems, thromboembolic complications and other imponderables were  discussed. The patient acknowledged the explanation, agreed to proceed with the plan and consent was signed. Patient is being admitted for inpatient treatment for surgery, pain control, PT, OT, prophylactic antibiotics, VTE prophylaxis, progressive ambulation and ADLs and discharge planning. The patient is planning to be discharged home with her husband.  Patient's anticipated LOS is less than 2 midnights, meeting these requirements: - Lives within 1 hour of care - Has a competent adult at home to recover with post-op recover - NO history of  - Chronic pain requiring opiods  - Diabetes  - Coronary Artery Disease  - Heart failure  - Heart attack  - Stroke  - DVT/VTE  - Cardiac arrhythmia  - Respiratory Failure/COPD  - Renal failure  - Anemia  - Advanced Liver disease      Therapy Plans: [facility] Disposition: Home with Husband Planned DVT Prophylaxis: Aspirin 325mg  DME Needed: [facility] Rile PCP: Carol Ada TXA: IV Allergies: N/A Anesthesia Concerns: N/A BMI: 30.1 Last HgbA1c: N/A  Pharmacy: [facility] [address]  - Patient was instructed on what medications to stop prior to surgery. - Follow-up visit in 2 weeks with Dr. Wynelle Link - Begin physical therapy following surgery - Pre-operative lab work as pre-surgical testing - Prescriptions will be provided in hospital at time of discharge  Fenton Foy, [facility], PA-C Orthopedic Surgery [facility] [place]

## 2020-05-21 ENCOUNTER — Other Ambulatory Visit (HOSPITAL_COMMUNITY)
Admission: RE | Admit: 2020-05-21 | Discharge: 2020-05-21 | Disposition: A | Discharge status: Home / Self Care | Payer: Medicare Other | Source: Ambulatory Visit | Attending: Orthopedic Surgery | Admitting: Orthopedic Surgery

## 2020-05-21 ENCOUNTER — Other Ambulatory Visit: Payer: Self-pay

## 2020-05-21 ENCOUNTER — Encounter (HOSPITAL_COMMUNITY)
Admission: RE | Admit: 2020-05-21 | Discharge: 2020-05-21 | Disposition: A | Discharge status: Home / Self Care | Payer: Medicare Other | Source: Ambulatory Visit | Attending: Orthopedic Surgery | Admitting: Orthopedic Surgery

## 2020-05-21 DIAGNOSIS — Z20822 Contact with and (suspected) exposure to covid-19: Secondary | ICD-10-CM | POA: Diagnosis not present

## 2020-05-21 DIAGNOSIS — E782 Mixed hyperlipidemia: Secondary | ICD-10-CM | POA: Diagnosis not present

## 2020-05-21 DIAGNOSIS — Z01812 Encounter for preprocedural laboratory examination: Secondary | ICD-10-CM | POA: Diagnosis not present

## 2020-05-21 DIAGNOSIS — F329 Major depressive disorder, single episode, unspecified: Secondary | ICD-10-CM | POA: Diagnosis not present

## 2020-05-21 DIAGNOSIS — D649 Anemia, unspecified: Secondary | ICD-10-CM | POA: Diagnosis not present

## 2020-05-21 DIAGNOSIS — G4700 Insomnia, unspecified: Secondary | ICD-10-CM | POA: Diagnosis not present

## 2020-05-21 DIAGNOSIS — K219 Gastro-esophageal reflux disease without esophagitis: Secondary | ICD-10-CM | POA: Diagnosis not present

## 2020-05-21 DIAGNOSIS — H35033 Hypertensive retinopathy, bilateral: Secondary | ICD-10-CM | POA: Diagnosis not present

## 2020-05-21 LAB — PROTIME-INR
INR: 1 (ref 0.8–1.2)
Prothrombin Time: 12.7 seconds (ref 11.4–15.2)

## 2020-05-21 LAB — CBC
HCT: 39.4 % (ref 36.0–46.0)
Hemoglobin: 12.7 g/dL (ref 12.0–15.0)
MCH: 28 pg (ref 26.0–34.0)
MCHC: 32.2 g/dL (ref 30.0–36.0)
MCV: 87 fL (ref 80.0–100.0)
Platelets: 187 10*3/uL (ref 150–400)
RBC: 4.53 MIL/uL (ref 3.87–5.11)
RDW: 13.6 % (ref 11.5–15.5)
WBC: 5.1 10*3/uL (ref 4.0–10.5)
nRBC: 0 % (ref 0.0–0.2)

## 2020-05-21 LAB — COMPREHENSIVE METABOLIC PANEL
ALT: 19 U/L (ref 0–44)
AST: 21 U/L (ref 15–41)
Albumin: 4.3 g/dL (ref 3.5–5.0)
Alkaline Phosphatase: 78 U/L (ref 38–126)
Anion gap: 11 (ref 5–15)
BUN: 15 mg/dL (ref 8–23)
CO2: 25 mmol/L (ref 22–32)
Calcium: 9.3 mg/dL (ref 8.9–10.3)
Chloride: 106 mmol/L (ref 98–111)
Creatinine, Ser: 0.87 mg/dL (ref 0.44–1.00)
GFR, Estimated: >60 mL/min (ref >60)
Glucose, Bld: 107 mg/dL — ABNORMAL HIGH (ref 70–99)
Potassium: 4.2 mmol/L (ref 3.5–5.1)
Sodium: 142 mmol/L (ref 135–145)
Total Bilirubin: 0.4 mg/dL (ref 0.3–1.2)
Total Protein: 6.6 g/dL (ref 6.5–8.1)

## 2020-05-21 LAB — APTT: aPTT: 28 seconds (ref 24–36)

## 2020-05-21 LAB — HEMOGLOBIN A1C
Hgb A1c MFr Bld: 5.3 % (ref 4.8–5.6)
Mean Plasma Glucose: 105.41 mg/dL

## 2020-05-21 LAB — SARS CORONAVIRUS 2 (TAT 6-24 HRS): SARS Coronavirus 2: NEGATIVE

## 2020-05-22 NOTE — Progress Notes (Signed)
Pt aware to arrive at [facility] admitting at 0655 on Monday 05/25/2020 for scheduled surgical procedure. Aware no food after midnight; clear liquids from midnight till 0615 consuming entire presurgery drink by 0615 then nothing by mouth.

## 2020-05-24 MED ORDER — BUPIVACAINE LIPOSOME 1.3 % IJ SUSP
20.0000 mL | INTRAMUSCULAR | Status: DC
  Filled 2020-05-24: qty 20

## 2020-05-25 ENCOUNTER — Encounter (HOSPITAL_COMMUNITY)
Admission: RE | Disposition: A | Discharge status: Home / Self Care | Payer: Self-pay | Source: Home / Self Care | Attending: Orthopedic Surgery

## 2020-05-25 ENCOUNTER — Ambulatory Visit (HOSPITAL_COMMUNITY): Payer: Medicare Other | Admitting: Anesthesiology

## 2020-05-25 ENCOUNTER — Encounter (HOSPITAL_COMMUNITY): Payer: Self-pay | Admitting: Orthopedic Surgery

## 2020-05-25 ENCOUNTER — Ambulatory Visit (HOSPITAL_COMMUNITY)
Admission: RE | Admit: 2020-05-25 | Discharge: 2020-05-25 | Disposition: A | Discharge status: Home / Self Care | Payer: Medicare Other | Attending: Orthopedic Surgery | Admitting: Orthopedic Surgery

## 2020-05-25 DIAGNOSIS — Z79899 Other long term (current) drug therapy: Secondary | ICD-10-CM | POA: Insufficient documentation

## 2020-05-25 DIAGNOSIS — Z96652 Presence of left artificial knee joint: Secondary | ICD-10-CM | POA: Diagnosis not present

## 2020-05-25 DIAGNOSIS — M1711 Unilateral primary osteoarthritis, right knee: Secondary | ICD-10-CM | POA: Diagnosis present

## 2020-05-25 DIAGNOSIS — Z888 Allergy status to other drugs, medicaments and biological substances status: Secondary | ICD-10-CM | POA: Insufficient documentation

## 2020-05-25 DIAGNOSIS — F419 Anxiety disorder, unspecified: Secondary | ICD-10-CM | POA: Diagnosis not present

## 2020-05-25 DIAGNOSIS — Z791 Long term (current) use of non-steroidal anti-inflammatories (NSAID): Secondary | ICD-10-CM | POA: Insufficient documentation

## 2020-05-25 DIAGNOSIS — K219 Gastro-esophageal reflux disease without esophagitis: Secondary | ICD-10-CM | POA: Diagnosis not present

## 2020-05-25 DIAGNOSIS — J189 Pneumonia, unspecified organism: Secondary | ICD-10-CM | POA: Diagnosis not present

## 2020-05-25 HISTORY — PX: TOTAL KNEE ARTHROPLASTY: SHX125

## 2020-05-25 LAB — TYPE AND SCREEN
ABO/RH(D): B POS
Antibody Screen: NEGATIVE

## 2020-05-25 LAB — SURGICAL PCR SCREEN
MRSA, PCR: NEGATIVE
Staphylococcus aureus: NEGATIVE

## 2020-05-25 SURGERY — ARTHROPLASTY, KNEE, TOTAL
Anesthesia: Monitor Anesthesia Care | Site: Knee | Laterality: Right

## 2020-05-25 MED ORDER — LACTATED RINGERS IV SOLN: INTRAVENOUS | Status: DC

## 2020-05-25 MED ORDER — BUPIVACAINE LIPOSOME 1.3 % IJ SUSP
INTRAMUSCULAR | Status: DC | PRN
  Administered 2020-05-25: 20 mL

## 2020-05-25 MED ORDER — CEFAZOLIN SODIUM-DEXTROSE 2-4 GM/100ML-% IV SOLN
2.0000 g | Freq: Four times a day (QID) | INTRAVENOUS | Status: DC
Start: 2020-05-25 — End: 2020-05-25

## 2020-05-25 MED ORDER — GLYCOPYRROLATE PF 0.2 MG/ML IJ SOSY
PREFILLED_SYRINGE | INTRAMUSCULAR | Status: AC
  Filled 2020-05-25: qty 1

## 2020-05-25 MED ORDER — STERILE WATER FOR IRRIGATION IR SOLN
Status: DC | PRN
  Administered 2020-05-25 (×2): 1000 mL

## 2020-05-25 MED ORDER — ONDANSETRON HCL 4 MG/2ML IJ SOLN
INTRAMUSCULAR | Status: DC | PRN
  Administered 2020-05-25: 4 mg via INTRAVENOUS

## 2020-05-25 MED ORDER — ASPIRIN EC 325 MG PO TBEC: 325.0000 mg | DELAYED_RELEASE_TABLET | Freq: Two times a day (BID) | ORAL | 0 refills | Status: AC

## 2020-05-25 MED ORDER — OXYCODONE HCL 5 MG PO TABS: 5.0000 mg | ORAL_TABLET | Freq: Four times a day (QID) | ORAL | 0 refills | Status: DC | PRN

## 2020-05-25 MED ORDER — MIDAZOLAM HCL 2 MG/2ML IJ SOLN
1.0000 mg | INTRAMUSCULAR | Status: DC
  Filled 2020-05-25: qty 2

## 2020-05-25 MED ORDER — FENTANYL CITRATE (PF) 100 MCG/2ML IJ SOLN
50.0000 ug | INTRAMUSCULAR | Status: DC
  Administered 2020-05-25: 50 ug via INTRAVENOUS
  Filled 2020-05-25: qty 2

## 2020-05-25 MED ORDER — METHOCARBAMOL 500 MG IVPB - SIMPLE MED: 500.0000 mg | Freq: Four times a day (QID) | INTRAVENOUS | Status: DC | PRN

## 2020-05-25 MED ORDER — CHLORHEXIDINE GLUCONATE 0.12 % MT SOLN
15.0000 mL | Freq: Once | OROMUCOSAL | Status: AC
  Administered 2020-05-25: 15 mL via OROMUCOSAL

## 2020-05-25 MED ORDER — POVIDONE-IODINE 10 % EX SWAB
2.0000 "application " | Freq: Once | CUTANEOUS | Status: AC
  Administered 2020-05-25: 2 via TOPICAL

## 2020-05-25 MED ORDER — PHENYLEPHRINE HCL-NACL 10-0.9 MG/250ML-% IV SOLN
INTRAVENOUS | Status: DC | PRN
  Administered 2020-05-25: 25 ug/min via INTRAVENOUS

## 2020-05-25 MED ORDER — GABAPENTIN 300 MG PO CAPS: ORAL_CAPSULE | ORAL | 0 refills | Status: DC

## 2020-05-25 MED ORDER — LACTATED RINGERS IV BOLUS
500.0000 mL | Freq: Once | INTRAVENOUS | Status: AC
  Administered 2020-05-25: 500 mL via INTRAVENOUS

## 2020-05-25 MED ORDER — TRANEXAMIC ACID-NACL 1000-0.7 MG/100ML-% IV SOLN
1000.0000 mg | INTRAVENOUS | Status: AC
  Administered 2020-05-25: 1000 mg via INTRAVENOUS
  Filled 2020-05-25: qty 100

## 2020-05-25 MED ORDER — DEXAMETHASONE SODIUM PHOSPHATE 10 MG/ML IJ SOLN: 8.0000 mg | Freq: Once | INTRAMUSCULAR | Status: DC

## 2020-05-25 MED ORDER — ORAL CARE MOUTH RINSE: 15.0000 mL | Freq: Once | OROMUCOSAL | Status: AC

## 2020-05-25 MED ORDER — ONDANSETRON HCL 4 MG/2ML IJ SOLN
4.0000 mg | Freq: Four times a day (QID) | INTRAMUSCULAR | Status: AC | PRN
  Administered 2020-05-25: 4 mg via INTRAVENOUS

## 2020-05-25 MED ORDER — 0.9 % SODIUM CHLORIDE (POUR BTL) OPTIME
TOPICAL | Status: DC | PRN
Start: 2020-05-25 — End: 2020-05-25
  Administered 2020-05-25: 1000 mL

## 2020-05-25 MED ORDER — SODIUM CHLORIDE (PF) 0.9 % IJ SOLN
INTRAMUSCULAR | Status: AC
  Filled 2020-05-25: qty 60

## 2020-05-25 MED ORDER — ACETAMINOPHEN 10 MG/ML IV SOLN
1000.0000 mg | Freq: Once | INTRAVENOUS | Status: AC
  Administered 2020-05-25: 1000 mg via INTRAVENOUS
  Filled 2020-05-25: qty 100

## 2020-05-25 MED ORDER — LIDOCAINE 2% (20 MG/ML) 5 ML SYRINGE
INTRAMUSCULAR | Status: DC | PRN
  Administered 2020-05-25: 40 mg via INTRAVENOUS

## 2020-05-25 MED ORDER — CEFAZOLIN SODIUM-DEXTROSE 2-4 GM/100ML-% IV SOLN
2.0000 g | INTRAVENOUS | Status: AC
  Administered 2020-05-25: 2 g via INTRAVENOUS
  Filled 2020-05-25: qty 100

## 2020-05-25 MED ORDER — OXYCODONE HCL 5 MG/5ML PO SOLN: 5.0000 mg | Freq: Once | ORAL | Status: AC | PRN

## 2020-05-25 MED ORDER — ONDANSETRON HCL 4 MG/2ML IJ SOLN
INTRAMUSCULAR | Status: AC
  Filled 2020-05-25: qty 2

## 2020-05-25 MED ORDER — OXYCODONE HCL 5 MG PO TABS
5.0000 mg | ORAL_TABLET | Freq: Once | ORAL | Status: AC | PRN
Start: 2020-05-25 — End: 2020-05-25
  Administered 2020-05-25: 5 mg via ORAL

## 2020-05-25 MED ORDER — ROPIVACAINE HCL 5 MG/ML IJ SOLN
INTRAMUSCULAR | Status: DC | PRN
  Administered 2020-05-25: 20 mL via PERINEURAL

## 2020-05-25 MED ORDER — METHOCARBAMOL 500 MG PO TABS: 500.0000 mg | ORAL_TABLET | Freq: Four times a day (QID) | ORAL | Status: DC | PRN

## 2020-05-25 MED ORDER — METHOCARBAMOL 500 MG PO TABS: 500.0000 mg | ORAL_TABLET | Freq: Four times a day (QID) | ORAL | 0 refills | Status: DC | PRN

## 2020-05-25 MED ORDER — OXYCODONE HCL 5 MG PO TABS
ORAL_TABLET | ORAL | Status: AC
  Filled 2020-05-25: qty 1

## 2020-05-25 MED ORDER — LACTATED RINGERS IV BOLUS
250.0000 mL | Freq: Once | INTRAVENOUS | Status: AC
  Administered 2020-05-25: 250 mL via INTRAVENOUS

## 2020-05-25 MED ORDER — DEXAMETHASONE SODIUM PHOSPHATE 10 MG/ML IJ SOLN
INTRAMUSCULAR | Status: DC | PRN
  Administered 2020-05-25: 4 mg via INTRAVENOUS

## 2020-05-25 MED ORDER — SODIUM CHLORIDE (PF) 0.9 % IJ SOLN
INTRAMUSCULAR | Status: DC | PRN
  Administered 2020-05-25: 60 mL

## 2020-05-25 MED ORDER — SODIUM CHLORIDE 0.9 % IV SOLN: INTRAVENOUS | Status: DC

## 2020-05-25 MED ORDER — PROPOFOL 10 MG/ML IV BOLUS
INTRAVENOUS | Status: DC | PRN
  Administered 2020-05-25 (×2): 20 mg via INTRAVENOUS

## 2020-05-25 MED ORDER — PROPOFOL 500 MG/50ML IV EMUL
INTRAVENOUS | Status: DC | PRN
  Administered 2020-05-25: 75 ug/kg/min via INTRAVENOUS

## 2020-05-25 MED ORDER — SODIUM CHLORIDE 0.9 % IR SOLN
Status: DC | PRN
  Administered 2020-05-25: 1000 mL

## 2020-05-25 MED ORDER — BUPIVACAINE IN DEXTROSE 0.75-8.25 % IT SOLN
INTRATHECAL | Status: DC | PRN
  Administered 2020-05-25: 1.5 mL via INTRATHECAL

## 2020-05-25 MED ORDER — FENTANYL CITRATE (PF) 100 MCG/2ML IJ SOLN: 25.0000 ug | INTRAMUSCULAR | Status: DC | PRN

## 2020-05-25 SURGICAL SUPPLY — 57 items
ATTUNE PS FEM RT SZ 5 CEM KNEE (Femur) ×1 IMPLANT
ATTUNE PSRP INSR SZ5 8 KNEE (Insert) ×2 IMPLANT
BAG SPEC THK2 15X12 ZIP CLS (MISCELLANEOUS) ×1
BAG ZIPLOCK 12X15 (MISCELLANEOUS) ×2 IMPLANT
BASE TIBIAL ROT PLAT SZ 5 KNEE (Knees) IMPLANT
BLADE SAG 18X100X1.27 (BLADE) ×2 IMPLANT
BLADE SAW SGTL 11.0X1.19X90.0M (BLADE) ×2 IMPLANT
BNDG ELASTIC 6X5.8 VLCR STR LF (GAUZE/BANDAGES/DRESSINGS) ×2 IMPLANT
BOWL SMART MIX CTS (DISPOSABLE) ×2 IMPLANT
BSPLAT TIB 5 CMNT ROT PLAT STR (Knees) ×1 IMPLANT
CEMENT HV SMART SET (Cement) ×4 IMPLANT
COVER SURGICAL LIGHT HANDLE (MISCELLANEOUS) ×2 IMPLANT
COVER WAND RF STERILE (DRAPES) IMPLANT
CUFF TOURN SGL QUICK 34 (TOURNIQUET CUFF) ×2
CUFF TRNQT CYL 34X4.125X (TOURNIQUET CUFF) ×1 IMPLANT
DECANTER SPIKE VIAL GLASS SM (MISCELLANEOUS) ×2 IMPLANT
DRAPE U-SHAPE 47X51 STRL (DRAPES) ×2 IMPLANT
DRSG AQUACEL AG ADV 3.5X10 (GAUZE/BANDAGES/DRESSINGS) ×2 IMPLANT
DURAPREP 26ML APPLICATOR (WOUND CARE) ×2 IMPLANT
ELECT REM PT RETURN 15FT ADLT (MISCELLANEOUS) ×2 IMPLANT
GLOVE BIO SURGEON STRL SZ8 (GLOVE) ×2 IMPLANT
GLOVE BIOGEL PI IND STRL 8.5 (GLOVE) ×1 IMPLANT
GLOVE BIOGEL PI INDICATOR 8.5 (GLOVE) ×1
GLOVE SRG 8 PF TXTR STRL LF DI (GLOVE) ×1 IMPLANT
GLOVE SURG ENC MOIS LTX SZ6 (GLOVE) IMPLANT
GLOVE SURG ENC MOIS LTX SZ7 (GLOVE) ×2 IMPLANT
GLOVE SURG UNDER POLY LF SZ6.5 (GLOVE) ×2 IMPLANT
GLOVE SURG UNDER POLY LF SZ8 (GLOVE) ×2
GOWN STRL REUS W/TWL LRG LVL3 (GOWN DISPOSABLE) ×4 IMPLANT
GOWN STRL REUS W/TWL XL LVL3 (GOWN DISPOSABLE) ×2 IMPLANT
HANDPIECE INTERPULSE COAX TIP (DISPOSABLE) ×2
HOLDER FOLEY CATH W/STRAP (MISCELLANEOUS) IMPLANT
IMMOBILIZER KNEE 20 (SOFTGOODS) ×2
IMMOBILIZER KNEE 20 THIGH 36 (SOFTGOODS) ×1 IMPLANT
KIT TURNOVER KIT A (KITS) IMPLANT
MANIFOLD NEPTUNE II (INSTRUMENTS) ×2 IMPLANT
NS IRRIG 1000ML POUR BTL (IV SOLUTION) ×2 IMPLANT
PACK TOTAL KNEE CUSTOM (KITS) ×2 IMPLANT
PADDING CAST COTTON 6X4 STRL (CAST SUPPLIES) ×4 IMPLANT
PADDING CAST SYNTHETIC 4 (CAST SUPPLIES) ×1
PADDING CAST SYNTHETIC 4X4 STR (CAST SUPPLIES) IMPLANT
PATELLA MEDIAL ATTUN 35MM KNEE (Knees) ×1 IMPLANT
PENCIL SMOKE EVACUATOR (MISCELLANEOUS) ×2 IMPLANT
PIN DRILL FIX HALF THREAD (BIT) ×1 IMPLANT
PIN STEINMAN FIXATION KNEE (PIN) ×1 IMPLANT
PROTECTOR NERVE ULNAR (MISCELLANEOUS) ×2 IMPLANT
SET HNDPC FAN SPRY TIP SCT (DISPOSABLE) ×1 IMPLANT
STRIP CLOSURE SKIN 1/2X4 (GAUZE/BANDAGES/DRESSINGS) ×3 IMPLANT
SUT MNCRL AB 4-0 PS2 18 (SUTURE) ×2 IMPLANT
SUT STRATAFIX 0 PDS 27 VIOLET (SUTURE) ×2
SUT VIC AB 2-0 CT1 27 (SUTURE) ×6
SUT VIC AB 2-0 CT1 TAPERPNT 27 (SUTURE) ×3 IMPLANT
SUTURE STRATFX 0 PDS 27 VIOLET (SUTURE) ×1 IMPLANT
TIBIAL BASE ROT PLAT SZ 5 KNEE (Knees) ×2 IMPLANT
TRAY FOLEY MTR SLVR 16FR STAT (SET/KITS/TRAYS/PACK) ×2 IMPLANT
WATER STERILE IRR 1000ML POUR (IV SOLUTION) ×4 IMPLANT
WRAP KNEE MAXI GEL POST OP (GAUZE/BANDAGES/DRESSINGS) ×2 IMPLANT

## 2020-05-25 NOTE — Transfer of Care (Signed)
Immediate Anesthesia Transfer of Care Note  Patient: [name]  Procedure(s) Performed: Procedure(s) with comments: TOTAL KNEE ARTHROPLASTY (Right) - 27min  Patient Location: PACU  Anesthesia Type:Spinal  Level of Consciousness:  sedated, patient cooperative and responds to stimulation  Airway & Oxygen Therapy:Patient Spontanous Breathing and Patient connected to face mask oxgen  Post-op Assessment:  Report given to PACU RN and Post -op Vital signs reviewed and stable  Post vital signs:  Reviewed and stable  Last Vitals:  Vitals:   05/25/20 0853 05/25/20 0908  BP: (!) 145/68 (!) 142/68  Pulse: 68 64  Resp: (!) 9 (!) 8  Temp:    SpO2: 412% 878%    Complications: No apparent anesthesia complications

## 2020-05-25 NOTE — Anesthesia Procedure Notes (Signed)
Anesthesia Regional Block: Adductor canal block   Pre-Anesthetic Checklist: ,, timeout performed, Correct Patient, Correct Site, Correct Laterality, Correct Procedure, Correct Position, site marked, Risks and benefits discussed,  Surgical consent,  Pre-op evaluation,  At surgeon's request and post-op pain management  Laterality: Right  Prep: chloraprep       Needles:  Injection technique: Single-shot  Needle Type: Echogenic Needle     Needle Length: 9cm  Needle Gauge: 21     Additional Needles:   Narrative:  Start time: 05/25/2020 8:46 AM End time: 05/25/2020 8:54 AM Injection made incrementally with aspirations every 5 mL.  Performed by: Personally  Anesthesiologist: Albertha Ghee, MD  Additional Notes: Pt tolerated the procedure well.

## 2020-05-25 NOTE — Interval H&P Note (Signed)
History and Physical Interval Note:  05/25/2020 7:20 AM  [name]  has presented today for surgery, with the diagnosis of right knee osteoarthritis.  The various methods of treatment have been discussed with the patient and family. After consideration of risks, benefits and other options for treatment, the patient has consented to  Procedure(s) with comments: TOTAL KNEE ARTHROPLASTY (Right) - 44min as a surgical intervention.  The patient's history has been reviewed, patient examined, no change in status, stable for surgery.  I have reviewed the patient's chart and labs.  Questions were answered to the patient's satisfaction.     [name]

## 2020-05-25 NOTE — Progress Notes (Signed)
Assisted Dr. Hodierne with right, ultrasound guided, adductor canal block. Side rails up, monitors on throughout procedure. See vital signs in flow sheet. Tolerated Procedure well.  

## 2020-05-25 NOTE — Anesthesia Postprocedure Evaluation (Signed)
Anesthesia Post Note  Patient: [name]  Procedure(s) Performed: TOTAL KNEE ARTHROPLASTY (Right Knee)     Patient location during evaluation: PACU Anesthesia Type: MAC and Spinal Level of consciousness: awake and alert Pain management: pain level controlled Vital Signs Assessment: post-procedure vital signs reviewed and stable Respiratory status: spontaneous breathing, nonlabored ventilation, respiratory function stable and patient connected to nasal cannula oxygen Cardiovascular status: stable and blood pressure returned to baseline Postop Assessment: no apparent nausea or vomiting Anesthetic complications: no   No complications documented.  Last Vitals:  Vitals:   05/25/20 1239 05/25/20 1244  BP: (!) 163/62 (!) 157/91  Pulse:  60  Resp:  12  Temp:  (!) 36.3 C  SpO2:  100%    Last Pain:  Vitals:   05/25/20 1244  TempSrc: Oral  PainSc: 0-No pain                 Morayo Leven S

## 2020-05-25 NOTE — Care Plan (Signed)
Ortho Bundle Case Management Note  Patient Details  Name: [name] MRN: [id number] Date of Birth: Dec 29, 1950  R TKA on 05-25-20 DCP:  Home with husband.  1 story home with 1 ste. DME:  RW ordered through [facility].  Doesn't want/need a 3-in-1. PT:  [facility].  PT eval scheduled on 05-28-20.                    DME Arranged:  Walker rolling DME Agency:  NA  HH Arranged:  NA HH Agency:  NA  Additional Comments: Please contact me with any questions of if this plan should need to change.  Marianne Sofia, RN,CCM [facility]  [phone] 05/25/2020, 9:41 AM

## 2020-05-25 NOTE — Anesthesia Preprocedure Evaluation (Signed)
Anesthesia Evaluation  Patient identified by MRN, date of birth, ID band Patient awake    Reviewed: Allergy & Precautions, H&P , NPO status , Patient's Chart, lab work & pertinent test results  Airway Mallampati: II   Neck ROM: full    Dental   Pulmonary neg pulmonary ROS,    breath sounds clear to auscultation       Cardiovascular negative cardio ROS   Rhythm:regular Rate:Normal     Neuro/Psych PSYCHIATRIC DISORDERS Anxiety    GI/Hepatic GERD  ,  Endo/Other    Renal/GU      Musculoskeletal  (+) Arthritis ,   Abdominal   Peds  Hematology   Anesthesia Other Findings   Reproductive/Obstetrics                             Anesthesia Physical Anesthesia Plan  ASA: II  Anesthesia Plan: Spinal and MAC   Post-op Pain Management:  Regional for Post-op pain   Induction: Intravenous  PONV Risk Score and Plan: 2 and Ondansetron, Propofol infusion and Treatment may vary due to age or medical condition  Airway Management Planned: Simple Face Mask  Additional Equipment:   Intra-op Plan:   Post-operative Plan:   Informed Consent: I have reviewed the patients History and Physical, chart, labs and discussed the procedure including the risks, benefits and alternatives for the proposed anesthesia with the patient or authorized representative who has indicated his/her understanding and acceptance.     Dental advisory given  Plan Discussed with: CRNA, Anesthesiologist and Surgeon  Anesthesia Plan Comments:         Anesthesia Quick Evaluation

## 2020-05-25 NOTE — Discharge Instructions (Signed)
 Melissa Aluisio, MD Total Joint Specialist [facility] [place] [address] [place], [place] [address] [phone]    TOTAL KNEE REPLACEMENT POSTOPERATIVE DIRECTIONS    Knee Rehabilitation, Guidelines Following Surgery  Results after knee surgery are often greatly improved when you follow the exercise, range of motion and muscle strengthening exercises prescribed by your doctor. Safety measures are also important to protect the knee from further injury. If any of these exercises cause you to have increased pain or swelling in your knee joint, decrease the amount until you are comfortable again and slowly increase them. If you have problems or questions, call your caregiver or physical therapist for advice.   BLOOD CLOT PREVENTION . Take a 325 mg Aspirin two times a day for three weeks following surgery. Then take an 81 mg Aspirin once a day for three weeks. Then discontinue Aspirin. . You may resume your vitamins/supplements upon discharge from the hospital. . Do not take any NSAIDs (Advil, Aleve, Ibuprofen, Meloxicam, etc.) until you have discontinued the 325 mg Aspirin.  HOME CARE INSTRUCTIONS  . Remove items at home which could result in a fall. This includes throw rugs or furniture in walking pathways.   ICE to the affected knee as much as tolerated. Icing helps control swelling. If the swelling is well controlled you will be more comfortable and rehab easier. Continue to use ice on the knee for pain and swelling from surgery. You may notice swelling that will progress down to the foot and ankle. This is normal after surgery. Elevate the leg when you are not up walking on it.    Continue to use the breathing machine which will help keep your temperature down.  It is common for your temperature to cycle up and down following surgery, especially at night when you are not up moving around and exerting yourself.  The breathing machine keeps your lungs expanded and  your temperature down.  Do not place pillow under knee, focus on keeping the knee straight while resting  PAIN CONTROL Achieving adequate pain control can be challenging in the first 48-72 hours after the nerve block wears off. During this time it is best to stay ahead of the pain by taking your prescribed pain meds every 4-6 hours. Once the pain is well controlled (you will not be pain free but the goal is having it under control) you may begin slowly weaning off the medications  DIET You may resume your previous home diet once you are discharged from the hospital.  DRESSING / WOUND CARE / SHOWERING . Keep your bulky bandage on for 2 days. On the third post-operative day you may remove the Ace bandage and gauze. There is a waterproof adhesive bandage on your skin which will stay in place until your first follow-up appointment. Once you remove this you will not need to place another bandage . You may begin showering 3 days following surgery, but do not submerge the incision under water.  ACTIVITY For the first 5 days the key is rest and control of pain and swelling . You should rest, ice and elevate the leg for 50 minutes out of every hour. Get up and walk/stretch for 10 minutes per hour. After 5 days you can increase your activity slowly as tolerated . Walk with your walker as instructed. Use the walker until you are comfortable transitioning to a cane. Walk with the cane in the opposite hand of the operative leg. You may discontinue the cane once   you are comfortable. . You may discontinue the knee immobilizer once you are able to perform a straight leg raise while lying down . Avoid periods of inactivity such as sitting longer than an hour when not asleep. This helps prevent blood clots.  . Do your home exercises twice a day starting on post-operative day 3. On the days you go to physical therapy, just do the home exercises once that day. . Do not drive a car until released by your surgeon.    . Do not drive while taking narcotics.  TED HOSE STOCKINGS Wear the elastic stockings on both legs for three weeks following surgery during the day. You may remove them at night for sleeping.  WEIGHT BEARING You may bear weight as tolerated on the operative leg.  POSTOPERATIVE CONSTIPATION PROTOCOL Constipation - defined medically as fewer than three stools per week and severe constipation as less than one stool per week.  One of the most common issues patients have following surgery is constipation.  Even if you have a regular bowel pattern at home, your normal regimen is likely to be disrupted due to multiple reasons following surgery.  Combination of anesthesia, postoperative narcotics, change in appetite and fluid intake all can affect your bowels.  In order to avoid complications following surgery, here are some recommendations in order to help you during your recovery period.  Colace (docusate) - Pick up an over-the-counter form of Colace or another stool softener and take twice a day as long as you are requiring postoperative pain medications.  Take with a full glass of water daily.  If you experience loose stools or diarrhea, hold the colace until you stool forms back up.  If your symptoms do not get better within 1 week or if they get worse, check with your doctor.  MiraLax (polyethylene glycol) - Pick up over-the-counter to have on hand.  MiraLax is a solution that will increase the amount of water in your bowels to assist with bowel movements.  Take as directed and can mix with a glass of water, juice, soda, coffee, or tea.  Take if you go more than two days without a movement. Do not use MiraLax more than once per day. Call your doctor if you are still constipated or irregular after using this medication for 7 days in a row.  If you continue to have problems with postoperative constipation, please contact the office for further assistance and recommendations.  If you experience "the  worst abdominal pain ever" or develop nausea or vomiting, please contact the office immediatly for further recommendations for treatment.  ITCHING  If you experience itching with your medications, try taking only a single pain pill, or even half a pain pill at a time.  You can also use Benadryl over the counter for itching or also to help with sleep.   MEDICATIONS See your medication summary on the "After Visit Summary" that the nursing staff will review with you prior to discharge.  You may have some home medications which will be placed on hold until you complete the course of blood thinner medication.  It is important for you to complete the blood thinner medication as prescribed by your surgeon.  Continue your approved medications as instructed at time of discharge.  PRECAUTIONS If you experience chest pain or shortness of breath - call 911 immediately for transfer to the hospital emergency department.  If you develop a fever greater that 101 F, purulent drainage from wound, increased redness or drainage   from wound, foul odor from the wound/dressing, or calf pain - CONTACT YOUR SURGEON.                                                   FOLLOW-UP APPOINTMENTS Make sure you keep all of your appointments after your operation with your surgeon and caregivers. You should call the office at the above phone number and make an appointment for approximately two weeks after the date of your surgery or on the date instructed by your surgeon outlined in the "After Visit Summary".  MAKE SURE YOU:  . Understand these instructions.  . Get help right away if you are not doing well or get worse.   DENTAL ANTIBIOTICS:  In most cases prophylactic antibiotics for Dental procdeures after total joint surgery are not necessary.  Exceptions are as follows:  1. History of prior total joint infection  2. Severely immunocompromised (Organ Transplant, cancer chemotherapy, Rheumatoid biologic meds such as  Humera)  3. Poorly controlled diabetes (A1C &gt; 8.0, blood glucose over 200)  If you have one of these conditions, contact your surgeon for an antibiotic prescription, prior to your dental procedure.    Pick up stool softner and laxative for home use following surgery while on pain medications. May shower starting three days after surgery. Please use a clean towel to pat the incision dry following showers. Continue to use ice for pain and swelling after surgery. Do not use any lotions or creams on the incision until instructed by your surgeon.  

## 2020-05-25 NOTE — Op Note (Signed)
OPERATIVE REPORT-TOTAL KNEE ARTHROPLASTY   Pre-operative diagnosis- Osteoarthritis  Right knee(s)  Post-operative diagnosis- Osteoarthritis Right knee(s)  Procedure-  Right  Total Knee Arthroplasty  Surgeon- Dione Plover. Mariely Mahr, MD  Assistant- Molli Barrows, PA-C   Anesthesia-  Adductor canal block and spinal  EBL- 25 ml   Drains None  Tourniquet time-  Total Tourniquet Time Documented: Thigh (Right) - 36 minutes Total: Thigh (Right) - 36 minutes     Complications- None  Condition-PACU - hemodynamically stable.   Brief Clinical Note  [name] is a 70 y.o. year old female with end stage OA of her right knee with progressively worsening pain and dysfunction. She has constant pain, with activity and at rest and significant functional deficits with difficulties even with ADLs. She has had extensive non-op management including analgesics, injections of cortisone and viscosupplements, and home exercise program, but remains in significant pain with significant dysfunction.Radiographs show bone on bone arthritis medial and patellofemoral. She presents now for right Total Knee Arthroplasty.    Procedure in detail---   The patient is brought into the operating room and positioned supine on the operating table. After successful administration of  Adductor canal block and spinal,   a tourniquet is placed high on the  Right thigh(s) and the lower extremity is prepped and draped in the usual sterile fashion. Time out is performed by the operating team and then the  Right lower extremity is wrapped in Esmarch, knee flexed and the tourniquet inflated to 300 mmHg.       A midline incision is made with a ten blade through the subcutaneous tissue to the level of the extensor mechanism. A fresh blade is used to make a medial parapatellar arthrotomy. Soft tissue over the proximal medial tibia is subperiosteally elevated to the joint line with a knife and into the semimembranosus bursa with a Cobb  elevator. Soft tissue over the proximal lateral tibia is elevated with attention being paid to avoiding the patellar tendon on the tibial tubercle. The patella is everted, knee flexed 90 degrees and the ACL and PCL are removed. Findings are bone on bone medial and patellofemoral with large global osteophytes.        The drill is used to create a starting hole in the distal femur and the canal is thoroughly irrigated with sterile saline to remove the fatty contents. The 5 degree Right  valgus alignment guide is placed into the femoral canal and the distal femoral cutting block is pinned to remove 9 mm off the distal femur. Resection is made with an oscillating saw.      The tibia is subluxed forward and the menisci are removed. The extramedullary alignment guide is placed referencing proximally at the medial aspect of the tibial tubercle and distally along the second metatarsal axis and tibial crest. The block is pinned to remove 78mm off the more deficient medial  side. Resection is made with an oscillating saw. Size 5is the most appropriate size for the tibia and the proximal tibia is prepared with the modular drill and keel punch for that size.      The femoral sizing guide is placed and size 5 is most appropriate. Rotation is marked off the epicondylar axis and confirmed by creating a rectangular flexion gap at 90 degrees. The size 5 cutting block is pinned in this rotation and the anterior, posterior and chamfer cuts are made with the oscillating saw. The intercondylar block is then placed and that cut is made.  Trial size 5 tibial component, trial size 5 posterior stabilized femur and a 8  mm posterior stabilized rotating platform insert trial is placed. Full extension is achieved with excellent varus/valgus and anterior/posterior balance throughout full range of motion. The patella is everted and thickness measured to be 22  mm. Free hand resection is taken to 12 mm, a 35 template is placed, lug holes  are drilled, trial patella is placed, and it tracks normally. Osteophytes are removed off the posterior femur with the trial in place. All trials are removed and the cut bone surfaces prepared with pulsatile lavage. Cement is mixed and once ready for implantation, the size 5 tibial implant, size  5 posterior stabilized femoral component, and the size 35 patella are cemented in place and the patella is held with the clamp. The trial insert is placed and the knee held in full extension. The Exparel (20 ml mixed with 60 ml saline) is injected into the extensor mechanism, posterior capsule, medial and lateral gutters and subcutaneous tissues.  All extruded cement is removed and once the cement is hard the permanent 8 mm posterior stabilized rotating platform insert is placed into the tibial tray.      The wound is copiously irrigated with saline solution and the extensor mechanism closed with # 0 Stratofix suture. The tourniquet is released for a total tourniquet time of 36  minutes. Flexion against gravity is 140 degrees and the patella tracks normally. Subcutaneous tissue is closed with 2.0 vicryl and subcuticular with running 4.0 Monocryl. The incision is cleaned and dried and steri-strips and a bulky sterile dressing are applied. The limb is placed into a knee immobilizer and the patient is awakened and transported to recovery in stable condition.      Please note that a surgical assistant was a medical necessity for this procedure in order to perform it in a safe and expeditious manner. Surgical assistant was necessary to retract the ligaments and vital neurovascular structures to prevent injury to them and also necessary for proper positioning of the limb to allow for anatomic placement of the prosthesis.   Dione Plover [name], MD    05/25/2020, 10:39 AM

## 2020-05-25 NOTE — Evaluation (Signed)
Physical Therapy Treatment Patient Details Name: [name] MRN: [id number] DOB: 10-Jun-1950 Today's Date: 05/25/2020    History of Present Illness s/p R TKA. PMH: L TKA    PT Comments    Patient evaluated by Physical Therapy with no further acute PT needs identified. All education has been completed and the patient has no further questions.  Pt progressing well today. amb with RW ~100', min-guard to supervision for safety. Steady gait overall, denies dizziness, fatigue or incr pain. Initiated  TKA HEP. Ready to d/c with family assist from PT standpoint.  See below for any follow-up Physical Therapy or equipment needs. PT is signing off. Thank you for this referral.    Follow Up Recommendations  Follow surgeon's recommendation for DC plan and follow-up therapies     Equipment Recommendations  Rolling walker with 5" wheels    Recommendations for Other Services       Precautions / Restrictions Precautions Precautions: Fall;Knee Restrictions Weight Bearing Restrictions: No Other Position/Activity Restrictions: WBAT    Mobility  Bed Mobility Overal bed mobility: Needs Assistance Bed Mobility: Supine to Sit;Sit to Supine     Supine to sit: Supervision Sit to supine: Supervision   General bed mobility comments: for safety, no physical assist  Transfers Overall transfer level: Needs assistance Equipment used: Rolling walker (2 wheeled) Transfers: Sit to/from Stand Sit to Stand: Min assist;Min guard         General transfer comment: cues for hand placement and RLE position. min/guard from stretcher, min assist from toilet  Ambulation/Gait Ambulation/Gait assistance: Min guard Gait Distance (Feet): 100 Feet Assistive device: Rolling walker (2 wheeled) Gait Pattern/deviations: Step-to pattern;Decreased stance time - right     General Gait Details: cues for sequence and RW position   Stairs             Wheelchair Mobility    Modified Rankin (Stroke  Patients Only)       Balance                                            Cognition Arousal/Alertness: Awake/alert Behavior During Therapy: WFL for tasks assessed/performed Overall Cognitive Status: Within Functional Limits for tasks assessed                                        Exercises Total Joint Exercises Ankle Circles/Pumps: AROM;Both;10 reps Quad Sets: 10 reps;Both;AROM Heel Slides: AAROM;Right;10 reps Straight Leg Raises: AROM;Right;5 reps    General Comments        Pertinent Vitals/Pain Pain Assessment: 0-10 Pain Score: 3  Pain Location: right knee Pain Descriptors / Indicators: Grimacing Pain Intervention(s): Limited activity within patient's tolerance;Monitored during session;Premedicated before session;Repositioned    Home Living Family/patient expects to be discharged to:: Private residence Living Arrangements: Spouse/significant other Available Help at Discharge: Family Type of Home: House Home Access: Stairs to enter   Home Layout: One level Home Equipment: Environmental consultant - 2 wheels      Prior Function Level of Independence: Independent          PT Goals (current goals can now be found in the care plan section) Acute Rehab PT Goals Patient Stated Goal: home today PT Goal Formulation: All assessment and education complete, DC therapy    Frequency  PT Plan      Co-evaluation              AM-PAC PT "6 Clicks" Mobility   Outcome Measure  Help needed turning from your back to your side while in a flat bed without using bedrails?: None Help needed moving from lying on your back to sitting on the side of a flat bed without using bedrails?: None Help needed moving to and from a bed to a chair (including a wheelchair)?: A Little Help needed standing up from a chair using your arms (e.g., wheelchair or bedside chair)?: A Little Help needed to walk in hospital room?: A Little Help needed climbing 3-5  steps with a railing? : A Little 6 Click Score: 20    End of Session Equipment Utilized During Treatment: Gait belt;Right knee immobilizer Activity Tolerance: Patient tolerated treatment well Patient left: with call bell/phone within reach;in bed Nurse Communication: Mobility status PT Visit Diagnosis: Difficulty in walking, not elsewhere classified (R26.2)     Time: 4696-2952 PT Time Calculation (min) (ACUTE ONLY): 24 min  Charges:  $Gait Training: 8-22 mins                     Baxter Flattery, PT  Acute Rehab Dept ([place]) [phone] Pager [phone]  05/25/2020    [facility] 05/25/2020, 3:07 PM

## 2020-05-26 ENCOUNTER — Encounter (HOSPITAL_COMMUNITY): Payer: Self-pay | Admitting: Orthopedic Surgery

## 2020-05-28 DIAGNOSIS — M25561 Pain in right knee: Secondary | ICD-10-CM | POA: Diagnosis not present

## 2020-06-01 DIAGNOSIS — M25561 Pain in right knee: Secondary | ICD-10-CM | POA: Diagnosis not present

## 2020-06-03 DIAGNOSIS — M25561 Pain in right knee: Secondary | ICD-10-CM | POA: Diagnosis not present

## 2020-06-08 DIAGNOSIS — M25561 Pain in right knee: Secondary | ICD-10-CM | POA: Diagnosis not present

## 2020-06-10 DIAGNOSIS — M25561 Pain in right knee: Secondary | ICD-10-CM | POA: Diagnosis not present

## 2020-06-12 DIAGNOSIS — M25561 Pain in right knee: Secondary | ICD-10-CM | POA: Diagnosis not present

## 2020-06-30 DIAGNOSIS — Z96651 Presence of right artificial knee joint: Secondary | ICD-10-CM | POA: Diagnosis not present

## 2020-06-30 DIAGNOSIS — Z471 Aftercare following joint replacement surgery: Secondary | ICD-10-CM | POA: Diagnosis not present

## 2020-07-16 DIAGNOSIS — G4700 Insomnia, unspecified: Secondary | ICD-10-CM | POA: Diagnosis not present

## 2020-07-16 DIAGNOSIS — H35033 Hypertensive retinopathy, bilateral: Secondary | ICD-10-CM | POA: Diagnosis not present

## 2020-07-16 DIAGNOSIS — E782 Mixed hyperlipidemia: Secondary | ICD-10-CM | POA: Diagnosis not present

## 2020-07-16 DIAGNOSIS — F325 Major depressive disorder, single episode, in full remission: Secondary | ICD-10-CM | POA: Diagnosis not present

## 2020-07-16 DIAGNOSIS — F329 Major depressive disorder, single episode, unspecified: Secondary | ICD-10-CM | POA: Diagnosis not present

## 2020-07-16 DIAGNOSIS — D649 Anemia, unspecified: Secondary | ICD-10-CM | POA: Diagnosis not present

## 2020-07-16 DIAGNOSIS — K219 Gastro-esophageal reflux disease without esophagitis: Secondary | ICD-10-CM | POA: Diagnosis not present

## 2020-07-23 DIAGNOSIS — Z01812 Encounter for preprocedural laboratory examination: Secondary | ICD-10-CM | POA: Diagnosis not present

## 2020-07-23 DIAGNOSIS — K219 Gastro-esophageal reflux disease without esophagitis: Secondary | ICD-10-CM | POA: Diagnosis not present

## 2020-07-23 DIAGNOSIS — R1030 Lower abdominal pain, unspecified: Secondary | ICD-10-CM | POA: Diagnosis not present

## 2020-07-23 DIAGNOSIS — Z86010 Personal history of colonic polyps: Secondary | ICD-10-CM | POA: Diagnosis not present

## 2020-07-23 DIAGNOSIS — K5900 Constipation, unspecified: Secondary | ICD-10-CM | POA: Diagnosis not present

## 2020-07-27 DIAGNOSIS — D122 Benign neoplasm of ascending colon: Secondary | ICD-10-CM | POA: Diagnosis not present

## 2020-07-27 DIAGNOSIS — K648 Other hemorrhoids: Secondary | ICD-10-CM | POA: Diagnosis not present

## 2020-07-27 DIAGNOSIS — Z86010 Personal history of colonic polyps: Secondary | ICD-10-CM | POA: Diagnosis not present

## 2020-07-28 DIAGNOSIS — M5416 Radiculopathy, lumbar region: Secondary | ICD-10-CM | POA: Diagnosis not present

## 2020-07-28 DIAGNOSIS — M792 Neuralgia and neuritis, unspecified: Secondary | ICD-10-CM | POA: Diagnosis not present

## 2020-07-29 DIAGNOSIS — D122 Benign neoplasm of ascending colon: Secondary | ICD-10-CM | POA: Diagnosis not present

## 2020-08-11 DIAGNOSIS — R945 Abnormal results of liver function studies: Secondary | ICD-10-CM | POA: Diagnosis not present

## 2020-08-11 DIAGNOSIS — R7989 Other specified abnormal findings of blood chemistry: Secondary | ICD-10-CM | POA: Diagnosis not present

## 2020-08-11 DIAGNOSIS — R946 Abnormal results of thyroid function studies: Secondary | ICD-10-CM | POA: Diagnosis not present

## 2020-08-24 DIAGNOSIS — K5900 Constipation, unspecified: Secondary | ICD-10-CM | POA: Diagnosis not present

## 2020-08-27 ENCOUNTER — Emergency Department (HOSPITAL_BASED_OUTPATIENT_CLINIC_OR_DEPARTMENT_OTHER): Payer: Medicare Other | Admitting: Radiology

## 2020-08-27 ENCOUNTER — Other Ambulatory Visit: Payer: Self-pay

## 2020-08-27 ENCOUNTER — Emergency Department (HOSPITAL_BASED_OUTPATIENT_CLINIC_OR_DEPARTMENT_OTHER)
Admission: EM | Admit: 2020-08-27 | Discharge: 2020-08-27 | Disposition: A | Discharge status: Home / Self Care | Payer: Medicare Other | Attending: Emergency Medicine | Admitting: Emergency Medicine

## 2020-08-27 ENCOUNTER — Emergency Department (HOSPITAL_BASED_OUTPATIENT_CLINIC_OR_DEPARTMENT_OTHER): Payer: Medicare Other

## 2020-08-27 ENCOUNTER — Encounter (HOSPITAL_BASED_OUTPATIENT_CLINIC_OR_DEPARTMENT_OTHER): Payer: Self-pay

## 2020-08-27 DIAGNOSIS — Y92017 Garden or yard in single-family (private) house as the place of occurrence of the external cause: Secondary | ICD-10-CM | POA: Diagnosis not present

## 2020-08-27 DIAGNOSIS — S0083XA Contusion of other part of head, initial encounter: Secondary | ICD-10-CM

## 2020-08-27 DIAGNOSIS — W01198A Fall on same level from slipping, tripping and stumbling with subsequent striking against other object, initial encounter: Secondary | ICD-10-CM | POA: Insufficient documentation

## 2020-08-27 DIAGNOSIS — S0003XA Contusion of scalp, initial encounter: Secondary | ICD-10-CM | POA: Diagnosis not present

## 2020-08-27 DIAGNOSIS — Z96653 Presence of artificial knee joint, bilateral: Secondary | ICD-10-CM | POA: Diagnosis not present

## 2020-08-27 DIAGNOSIS — Z043 Encounter for examination and observation following other accident: Secondary | ICD-10-CM | POA: Diagnosis not present

## 2020-08-27 DIAGNOSIS — S022XXA Fracture of nasal bones, initial encounter for closed fracture: Secondary | ICD-10-CM | POA: Diagnosis not present

## 2020-08-27 DIAGNOSIS — R55 Syncope and collapse: Secondary | ICD-10-CM

## 2020-08-27 DIAGNOSIS — M160 Bilateral primary osteoarthritis of hip: Secondary | ICD-10-CM | POA: Diagnosis not present

## 2020-08-27 DIAGNOSIS — S0992XA Unspecified injury of nose, initial encounter: Secondary | ICD-10-CM | POA: Diagnosis present

## 2020-08-27 DIAGNOSIS — S199XXA Unspecified injury of neck, initial encounter: Secondary | ICD-10-CM | POA: Diagnosis not present

## 2020-08-27 DIAGNOSIS — M4802 Spinal stenosis, cervical region: Secondary | ICD-10-CM | POA: Diagnosis not present

## 2020-08-27 LAB — URINALYSIS, ROUTINE W REFLEX MICROSCOPIC
Bilirubin Urine: NEGATIVE
Glucose, UA: NEGATIVE mg/dL
Hgb urine dipstick: NEGATIVE
Ketones, ur: NEGATIVE mg/dL
Nitrite: NEGATIVE
Specific Gravity, Urine: 1.03 (ref 1.005–1.030)
pH: 5.5 (ref 5.0–8.0)

## 2020-08-27 LAB — CBG MONITORING, ED: Glucose-Capillary: 109 mg/dL — ABNORMAL HIGH (ref 70–99)

## 2020-08-27 LAB — COMPREHENSIVE METABOLIC PANEL
ALT: 10 U/L (ref 0–44)
AST: 14 U/L — ABNORMAL LOW (ref 15–41)
Albumin: 4.5 g/dL (ref 3.5–5.0)
Alkaline Phosphatase: 97 U/L (ref 38–126)
Anion gap: 9 (ref 5–15)
BUN: 24 mg/dL — ABNORMAL HIGH (ref 8–23)
CO2: 26 mmol/L (ref 22–32)
Calcium: 9.5 mg/dL (ref 8.9–10.3)
Chloride: 107 mmol/L (ref 98–111)
Creatinine, Ser: 0.8 mg/dL (ref 0.44–1.00)
GFR, Estimated: >60 mL/min (ref >60)
Glucose, Bld: 111 mg/dL — ABNORMAL HIGH (ref 70–99)
Potassium: 4 mmol/L (ref 3.5–5.1)
Sodium: 142 mmol/L (ref 135–145)
Total Bilirubin: 0.4 mg/dL (ref 0.3–1.2)
Total Protein: 6.6 g/dL (ref 6.5–8.1)

## 2020-08-27 LAB — CBC WITH DIFFERENTIAL/PLATELET
Abs Immature Granulocytes: 0.02 10*3/uL (ref 0.00–0.07)
Basophils Absolute: 0 10*3/uL (ref 0.0–0.1)
Basophils Relative: 0 %
Eosinophils Absolute: 0.1 10*3/uL (ref 0.0–0.5)
Eosinophils Relative: 1 %
HCT: 37.2 % (ref 36.0–46.0)
Hemoglobin: 12 g/dL (ref 12.0–15.0)
Immature Granulocytes: 0 %
Lymphocytes Relative: 14 %
Lymphs Abs: 1 10*3/uL (ref 0.7–4.0)
MCH: 26.6 pg (ref 26.0–34.0)
MCHC: 32.3 g/dL (ref 30.0–36.0)
MCV: 82.5 fL (ref 80.0–100.0)
Monocytes Absolute: 0.6 10*3/uL (ref 0.1–1.0)
Monocytes Relative: 9 %
Neutro Abs: 5.6 10*3/uL (ref 1.7–7.7)
Neutrophils Relative %: 76 %
Platelets: 179 10*3/uL (ref 150–400)
RBC: 4.51 MIL/uL (ref 3.87–5.11)
RDW: 14.2 % (ref 11.5–15.5)
WBC: 7.4 10*3/uL (ref 4.0–10.5)
nRBC: 0 % (ref 0.0–0.2)

## 2020-08-27 LAB — PROTIME-INR
INR: 0.9 (ref 0.8–1.2)
Prothrombin Time: 12.4 seconds (ref 11.4–15.2)

## 2020-08-27 MED ORDER — SODIUM CHLORIDE 0.9 % IV SOLN: INTRAVENOUS | Status: DC

## 2020-08-27 MED ORDER — SODIUM CHLORIDE 0.9 % IV BOLUS
1000.0000 mL | Freq: Once | INTRAVENOUS | Status: AC
  Administered 2020-08-27: 1000 mL via INTRAVENOUS

## 2020-08-27 NOTE — ED Notes (Signed)
Pt is back to room from CT 

## 2020-08-27 NOTE — ED Triage Notes (Signed)
Pt arrived via POV for fall. Pt has a large hematoma above her left eye. Pt is not on any blood thinners and doesn't recall any LOC. Pt states she was going to cut her grass and the next thing she recalls was her laying on her patio.

## 2020-08-27 NOTE — ED Provider Notes (Signed)
[facility]ER[facility]ENC[facility] DEPT Provider Note   CSN: [id number] Arrival date & time: 08/27/20  1940     History Chief Complaint  Patient presents with  . Fall    [name] is a 70 y.o. female.  Pt presents to the ED today with a fall.  Pt is not sure what exactly happened.  Pt went out to the patio and was getting ready to cut her grass.  The next thing she knew, she was lying on the ground in her patio.  She did hit her head and sustained a large hematoma and abrasion to the left side of her face and nose.  Pt was able to ambulate after she fell.  She denies any headache/dizziness/n/v.         Past Medical History:  Diagnosis Date  . Anxiety   . Arthritis   . GERD (gastroesophageal reflux disease)   . Pneumonia   . Pre-diabetes     Patient Active Problem List   Diagnosis Date Noted  . Osteoarthritis of right knee 05/25/2020  . Pain in left knee 08/15/2018  . H/O total knee replacement, left 07/18/2018  . Trigger thumb of left hand 04/23/2018  . Aftercare 11/09/2017  . Radial styloid tenosynovitis of left hand 09/07/2017  . Osteoarthritis of left knee 08/03/2017    Past Surgical History:  Procedure Laterality Date  . CESAREAN SECTION     x2   . FINGER SURGERY     rt little finger trigger   . FOOT SURGERY     right   foot  tendon  . PLANTAR FASCIA RELEASE     left  . SHOULDER ARTHROSCOPY WITH SUBACROMIAL DECOMPRESSION AND BICEP TENDON REPAIR Left 06/12/2015   Procedure: LEFT SHOULDER ARTHROSCOPY WITH DEBRIDEMENT OF SLAP, SUBACROMIAL DECOMPRESSION AND BICEP TENODESIS;  Surgeon: Netta Cedars, MD;  Location: [facility];  Service: Orthopedics;  Laterality: Left;  . TOTAL KNEE ARTHROPLASTY Left 07/18/2018   Procedure: TOTAL KNEE ARTHROPLASTY;  Surgeon: [name], MD;  Location: [facility];  Service: Orthopedics;  Laterality: Left;  127min  . TOTAL KNEE ARTHROPLASTY Right 05/25/2020   Procedure: TOTAL KNEE ARTHROPLASTY;  Surgeon: Gaynelle Arabian, MD;   Location: [facility];  Service: Orthopedics;  Laterality: Right;  66min     OB History   No obstetric history on file.     No family history on file.  Social History   Tobacco Use  . Smoking status: Never Smoker  . Smokeless tobacco: Never Used  Vaping Use  . Vaping Use: Never used  Substance Use Topics  . Alcohol use: No  . Drug use: No    Home Medications Prior to Admission medications   Medication Sig Start Date End Date Taking? Authorizing Provider  acetaminophen (TYLENOL) 325 MG tablet Take 650 mg by mouth every 6 (six) hours as needed.    [provider]  gabapentin (NEURONTIN) 300 MG capsule Take a 300 mg capsule three times a day for two weeks following surgery.Then take a 300 mg capsule two times a day for two weeks. Then take a 300 mg capsule once a day for two weeks. Then discontinue. 05/25/20   Edmisten, Ok Anis, PA  methocarbamol (ROBAXIN) 500 MG tablet Take 1 tablet (500 mg total) by mouth every 6 (six) hours as needed for muscle spasms. 05/25/20   Edmisten, Ok Anis, PA  Multiple Vitamins-Minerals (MULTIVITAMIN WITH MINERALS) tablet Take 1 tablet by mouth daily.    [provider]  oxyCODONE (ROXICODONE) 5 MG  immediate release tablet Take 1-2 tablets (5-10 mg total) by mouth every 6 (six) hours as needed for moderate pain or severe pain. 05/25/20 05/25/21  Edmisten, Kristie L, PA  pantoprazole (PROTONIX) 40 MG tablet Take 40 mg by mouth daily.    [provider]  Probiotic Product (PROBIOTIC DAILY PO) Take 1 capsule by mouth daily.    [provider]  rosuvastatin (CRESTOR) 20 MG tablet Take 20 mg by mouth daily.    [provider]  sertraline (ZOLOFT) 100 MG tablet Take 100 mg by mouth in the morning and at bedtime.    [provider]    Allergies    Cepacol  [benzocaine-menthol]  Review of Systems   Review of Systems  HENT: Positive for facial swelling.   All other systems reviewed and are  negative.   Physical Exam Updated Vital Signs BP (!) 151/69 (BP Location: Right Arm)   Pulse 66   Temp 98.1 F (36.7 C)   Resp 20   Ht 5\' 6"  (1.676 m)   Wt 83.5 kg   SpO2 100%   BMI 29.70 kg/m   Physical Exam Vitals and nursing note reviewed.  HENT:     Head: Normocephalic.      Comments: Hematoma to left forehead    Right Ear: External ear normal.     Left Ear: External ear normal.     Nose:      Mouth/Throat:     Mouth: Mucous membranes are moist.     Pharynx: Oropharynx is clear.  Eyes:     Extraocular Movements: Extraocular movements intact.     Conjunctiva/sclera: Conjunctivae normal.     Pupils: Pupils are equal, round, and reactive to light.  Cardiovascular:     Rate and Rhythm: Normal rate and regular rhythm.     Pulses: Normal pulses.     Heart sounds: Normal heart sounds.  Pulmonary:     Effort: Pulmonary effort is normal.     Breath sounds: Normal breath sounds.  Abdominal:     General: Abdomen is flat. Bowel sounds are normal.     Palpations: Abdomen is soft.  Musculoskeletal:        General: Normal range of motion.     Cervical back: Normal range of motion and neck supple.  Skin:    General: Skin is warm.     Capillary Refill: Capillary refill takes less than 2 seconds.  Neurological:     General: No focal deficit present.     Mental Status: She is alert and oriented to person, place, and time.  Psychiatric:        Mood and Affect: Mood normal.        Behavior: Behavior normal.        Thought Content: Thought content normal.        Judgment: Judgment normal.     ED Results / Procedures / Treatments   Labs (all labs ordered are listed, but only abnormal results are displayed) Labs Reviewed  COMPREHENSIVE METABOLIC PANEL - Abnormal; Notable for the following components:      Result Value   Glucose, Bld 111 (*)    BUN 24 (*)    AST 14 (*)    All other components within normal limits  URINALYSIS, ROUTINE W REFLEX MICROSCOPIC - Abnormal;  Notable for the following components:   Protein, ur TRACE (*)    Leukocytes,Ua SMALL (*)    All other components within normal limits  CBG MONITORING, ED -  Abnormal; Notable for the following components:   Glucose-Capillary 109 (*)    All other components within normal limits  CBC WITH DIFFERENTIAL/PLATELET  PROTIME-INR    EKG None  Radiology DG Chest 2 View  Result Date: 08/27/2020 CLINICAL DATA:  Fall today. EXAM: CHEST - 2 VIEW COMPARISON:  05/20/2019 FINDINGS: The cardiomediastinal contours are normal. The lungs are clear. Pulmonary vasculature is normal. No consolidation, pleural effusion, or pneumothorax. Thoracic spondylosis. No acute osseous abnormalities are seen. IMPRESSION: No acute chest findings.  No evidence of traumatic injury. Electronically Signed   By: Keith Rake M.D.   On: 08/27/2020 21:33   DG Pelvis 1-2 Views  Result Date: 08/27/2020 CLINICAL DATA:  Fall today. EXAM: PELVIS - 1-2 VIEW COMPARISON:  None. FINDINGS: The cortical margins of the bony pelvis are intact. No fracture. Pubic symphysis and sacroiliac joints are congruent. Both femoral heads are well-seated in the respective acetabula. Bilateral hip osteoarthritis. IMPRESSION: No pelvic fracture. Electronically Signed   By: Keith Rake M.D.   On: 08/27/2020 21:34   CT HEAD WO CONTRAST  Result Date: 08/27/2020 CLINICAL DATA:  Trauma from a fall. Large hematoma above the left eye. No blood thinners. No loss of consciousness. EXAM: CT HEAD WITHOUT CONTRAST CT MAXILLOFACIAL WITHOUT CONTRAST CT CERVICAL SPINE WITHOUT CONTRAST TECHNIQUE: Multidetector CT imaging of the head, cervical spine, and maxillofacial structures were performed using the standard protocol without intravenous contrast. Multiplanar CT image reconstructions of the cervical spine and maxillofacial structures were also generated. COMPARISON:  None. FINDINGS: CT HEAD FINDINGS Brain: No evidence of acute infarction, hemorrhage, hydrocephalus,  extra-axial collection or mass lesion/mass effect. Mild diffuse cerebral atrophy. Patchy low-attenuation changes in the deep white matter consistent with small vessel ischemia. Vascular: Moderate intracranial arterial vascular calcifications. Skull: The calvarium appears intact. Large subcutaneous scalp hematoma over the left anterior frontal and supraorbital region. Other: None. CT MAXILLOFACIAL FINDINGS Osseous: Fractures of the anterior and left lateral nasal bones with mild depression. The orbital rims, facial bones, and mandibles appear intact. Multiple prior tooth extractions and dental reconstructions. Degenerative changes in the temporomandibular joints. Orbits: The globes and extraocular muscles appear intact and symmetrical. Sinuses: Mild mucosal thickening in the maxillary antra. Paranasal sinuses and mastoid air cells are otherwise clear. Soft tissues: Large subcutaneous soft tissue scalp hematoma in the left anterior frontal and supraorbital region. Soft tissues are otherwise normal. CT CERVICAL SPINE FINDINGS Alignment: Normal alignment. Skull base and vertebrae: Skull base appears intact. No vertebral compression deformities. No focal bone lesion or bone destruction. Soft tissues and spinal canal: No prevertebral soft tissue swelling. No abnormal paraspinal soft tissue mass or infiltration. Disc levels: Mild degenerative changes with endplate hypertrophic changes throughout the cervical spine. More prominent degenerative changes visualized in the upper thoracic spine. Mild degenerative changes in the mid cervical facet joints. Upper chest: Lung apices are clear. Other: None. IMPRESSION: 1. No acute intracranial abnormalities. Chronic atrophy and small vessel ischemic changes. 2. Fractures of the anterior and left lateral nasal bones with mild depression. Large subcutaneous soft tissue scalp hematoma over the left anterior frontal and supraorbital region. 3. Normal alignment of the cervical spine. Mild  degenerative changes. No acute displaced fractures identified. Electronically Signed   By: Lucienne Capers M.D.   On: 08/27/2020 21:22   CT CERVICAL SPINE WO CONTRAST  Result Date: 08/27/2020 CLINICAL DATA:  Trauma from a fall. Large hematoma above the left eye. No blood thinners. No loss of consciousness. EXAM: CT HEAD WITHOUT CONTRAST CT MAXILLOFACIAL  WITHOUT CONTRAST CT CERVICAL SPINE WITHOUT CONTRAST TECHNIQUE: Multidetector CT imaging of the head, cervical spine, and maxillofacial structures were performed using the standard protocol without intravenous contrast. Multiplanar CT image reconstructions of the cervical spine and maxillofacial structures were also generated. COMPARISON:  None. FINDINGS: CT HEAD FINDINGS Brain: No evidence of acute infarction, hemorrhage, hydrocephalus, extra-axial collection or mass lesion/mass effect. Mild diffuse cerebral atrophy. Patchy low-attenuation changes in the deep white matter consistent with small vessel ischemia. Vascular: Moderate intracranial arterial vascular calcifications. Skull: The calvarium appears intact. Large subcutaneous scalp hematoma over the left anterior frontal and supraorbital region. Other: None. CT MAXILLOFACIAL FINDINGS Osseous: Fractures of the anterior and left lateral nasal bones with mild depression. The orbital rims, facial bones, and mandibles appear intact. Multiple prior tooth extractions and dental reconstructions. Degenerative changes in the temporomandibular joints. Orbits: The globes and extraocular muscles appear intact and symmetrical. Sinuses: Mild mucosal thickening in the maxillary antra. Paranasal sinuses and mastoid air cells are otherwise clear. Soft tissues: Large subcutaneous soft tissue scalp hematoma in the left anterior frontal and supraorbital region. Soft tissues are otherwise normal. CT CERVICAL SPINE FINDINGS Alignment: Normal alignment. Skull base and vertebrae: Skull base appears intact. No vertebral compression  deformities. No focal bone lesion or bone destruction. Soft tissues and spinal canal: No prevertebral soft tissue swelling. No abnormal paraspinal soft tissue mass or infiltration. Disc levels: Mild degenerative changes with endplate hypertrophic changes throughout the cervical spine. More prominent degenerative changes visualized in the upper thoracic spine. Mild degenerative changes in the mid cervical facet joints. Upper chest: Lung apices are clear. Other: None. IMPRESSION: 1. No acute intracranial abnormalities. Chronic atrophy and small vessel ischemic changes. 2. Fractures of the anterior and left lateral nasal bones with mild depression. Large subcutaneous soft tissue scalp hematoma over the left anterior frontal and supraorbital region. 3. Normal alignment of the cervical spine. Mild degenerative changes. No acute displaced fractures identified. Electronically Signed   By: Lucienne Capers M.D.   On: 08/27/2020 21:22   CT Maxillofacial Wo Contrast  Result Date: 08/27/2020 CLINICAL DATA:  Trauma from a fall. Large hematoma above the left eye. No blood thinners. No loss of consciousness. EXAM: CT HEAD WITHOUT CONTRAST CT MAXILLOFACIAL WITHOUT CONTRAST CT CERVICAL SPINE WITHOUT CONTRAST TECHNIQUE: Multidetector CT imaging of the head, cervical spine, and maxillofacial structures were performed using the standard protocol without intravenous contrast. Multiplanar CT image reconstructions of the cervical spine and maxillofacial structures were also generated. COMPARISON:  None. FINDINGS: CT HEAD FINDINGS Brain: No evidence of acute infarction, hemorrhage, hydrocephalus, extra-axial collection or mass lesion/mass effect. Mild diffuse cerebral atrophy. Patchy low-attenuation changes in the deep white matter consistent with small vessel ischemia. Vascular: Moderate intracranial arterial vascular calcifications. Skull: The calvarium appears intact. Large subcutaneous scalp hematoma over the left anterior frontal  and supraorbital region. Other: None. CT MAXILLOFACIAL FINDINGS Osseous: Fractures of the anterior and left lateral nasal bones with mild depression. The orbital rims, facial bones, and mandibles appear intact. Multiple prior tooth extractions and dental reconstructions. Degenerative changes in the temporomandibular joints. Orbits: The globes and extraocular muscles appear intact and symmetrical. Sinuses: Mild mucosal thickening in the maxillary antra. Paranasal sinuses and mastoid air cells are otherwise clear. Soft tissues: Large subcutaneous soft tissue scalp hematoma in the left anterior frontal and supraorbital region. Soft tissues are otherwise normal. CT CERVICAL SPINE FINDINGS Alignment: Normal alignment. Skull base and vertebrae: Skull base appears intact. No vertebral compression deformities. No focal bone lesion or bone destruction.  Soft tissues and spinal canal: No prevertebral soft tissue swelling. No abnormal paraspinal soft tissue mass or infiltration. Disc levels: Mild degenerative changes with endplate hypertrophic changes throughout the cervical spine. More prominent degenerative changes visualized in the upper thoracic spine. Mild degenerative changes in the mid cervical facet joints. Upper chest: Lung apices are clear. Other: None. IMPRESSION: 1. No acute intracranial abnormalities. Chronic atrophy and small vessel ischemic changes. 2. Fractures of the anterior and left lateral nasal bones with mild depression. Large subcutaneous soft tissue scalp hematoma over the left anterior frontal and supraorbital region. 3. Normal alignment of the cervical spine. Mild degenerative changes. No acute displaced fractures identified. Electronically Signed   By: Lucienne Capers M.D.   On: 08/27/2020 21:22    Procedures Procedures   Medications Ordered in ED Medications  sodium chloride 0.9 % bolus 1,000 mL (1,000 mLs Intravenous New Bag/Given 08/27/20 2035)    And  0.9 %  sodium chloride infusion (has  no administration in time range)    ED Course  I have reviewed the triage vital signs and the nursing notes.  Pertinent labs & imaging results that were available during my care of the patient were reviewed by me and considered in my medical decision making (see chart for details).    MDM Rules/Calculators/A&P                          Unclear etiology for fall.  Pt said she gets dizzy sometimes.  She has an appt in June to see neurology.  Pt is offered admission for obs, but she wants to go home.  She said she feels fine now. She is able to ambulate.  She is to return if worse.  She is to f/u with ent and with pcp.  Final Clinical Impression(s) / ED Diagnoses Final diagnoses:  Syncope, unspecified syncope type  Closed fracture of nasal bone, initial encounter  Facial hematoma, initial encounter    Rx / DC Orders ED Discharge Orders    None       Isla Pence, MD 08/27/20 2204

## 2020-09-03 DIAGNOSIS — Z9181 History of falling: Secondary | ICD-10-CM | POA: Diagnosis not present

## 2020-09-03 DIAGNOSIS — T148XXA Other injury of unspecified body region, initial encounter: Secondary | ICD-10-CM | POA: Diagnosis not present

## 2020-09-03 DIAGNOSIS — S0083XD Contusion of other part of head, subsequent encounter: Secondary | ICD-10-CM | POA: Diagnosis not present

## 2020-09-03 DIAGNOSIS — J309 Allergic rhinitis, unspecified: Secondary | ICD-10-CM | POA: Diagnosis not present

## 2020-09-03 DIAGNOSIS — Z23 Encounter for immunization: Secondary | ICD-10-CM | POA: Diagnosis not present

## 2020-09-12 IMAGING — CR DG CHEST 2V
2 series · 2 of 2 positions shown · non-contrast
Comparison: Rib series today.

CLINICAL DATA: Left lateral rib pain

EXAM:
CHEST - 2 VIEW

[w chest pa]
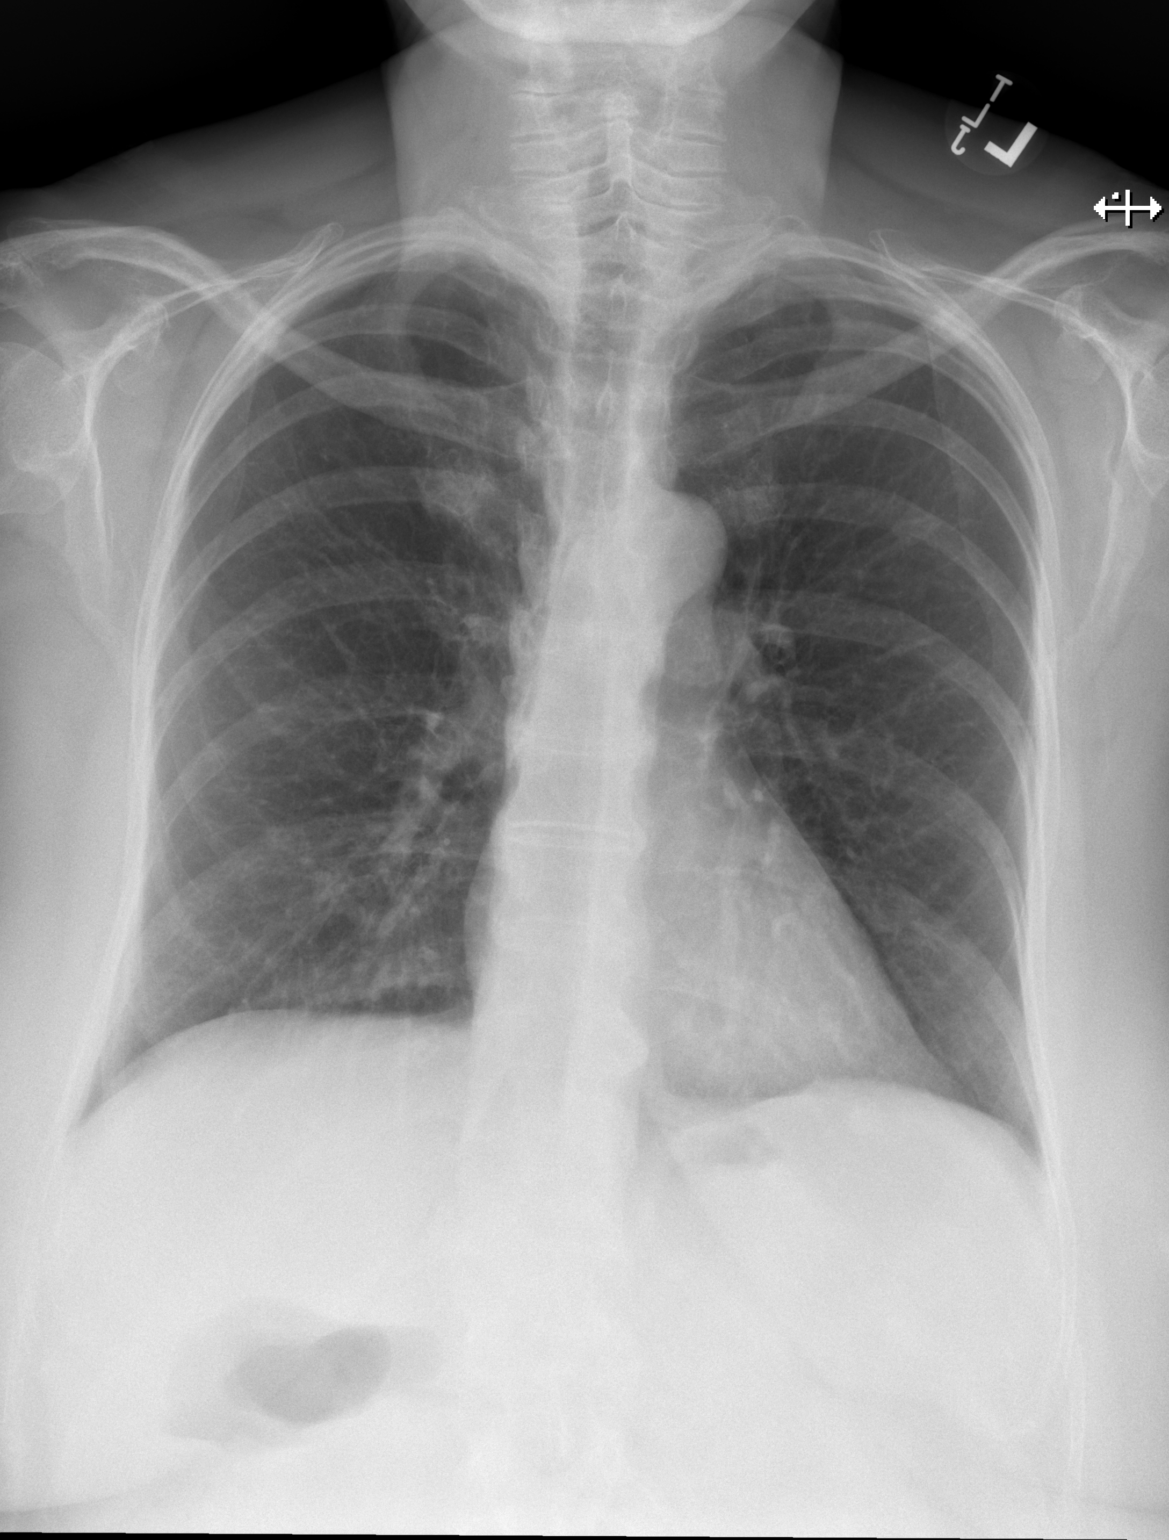

[w chest lat]
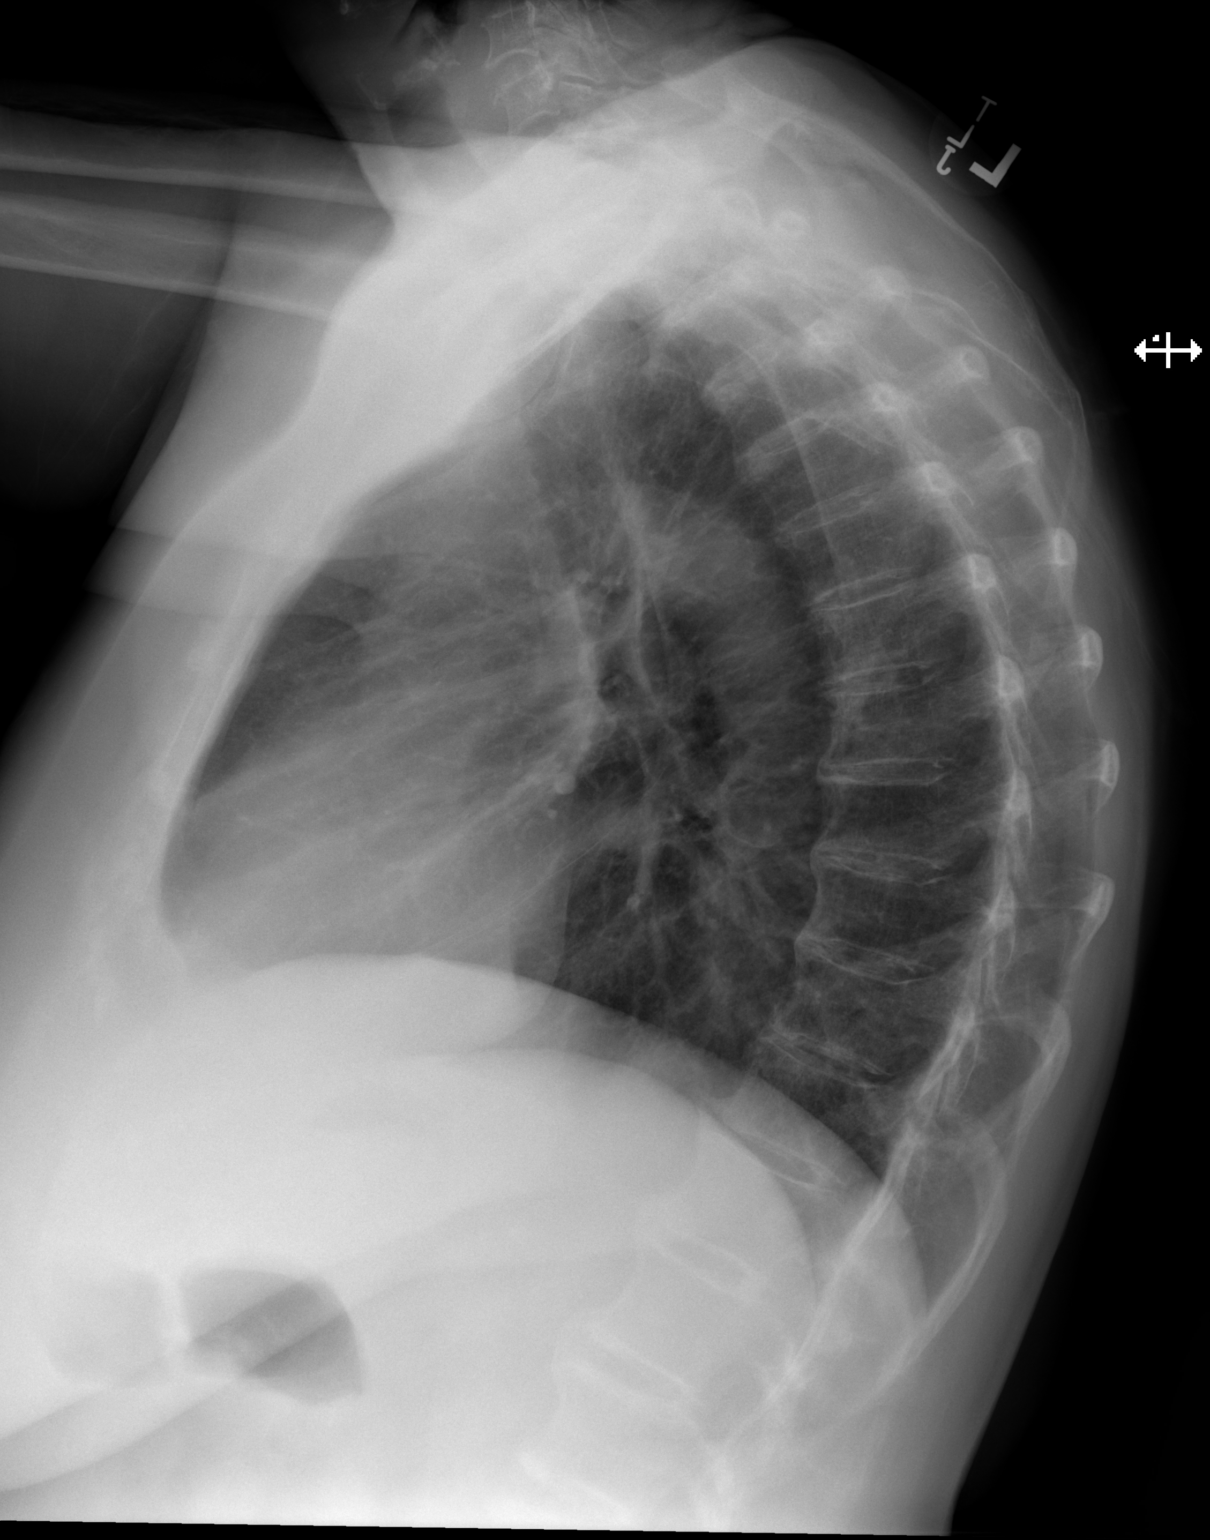

[2 of 2 positions shown; findings below may reference images not displayed]

FINDINGS: The heart size and mediastinal contours are within normal limits.
Both lungs are clear. The visualized skeletal structures are
unremarkable.
IMPRESSION: No active cardiopulmonary disease.

## 2020-09-15 DIAGNOSIS — H2511 Age-related nuclear cataract, right eye: Secondary | ICD-10-CM | POA: Diagnosis not present

## 2020-09-15 DIAGNOSIS — H35033 Hypertensive retinopathy, bilateral: Secondary | ICD-10-CM | POA: Diagnosis not present

## 2020-09-15 DIAGNOSIS — H04123 Dry eye syndrome of bilateral lacrimal glands: Secondary | ICD-10-CM | POA: Diagnosis not present

## 2020-09-15 DIAGNOSIS — H2513 Age-related nuclear cataract, bilateral: Secondary | ICD-10-CM | POA: Diagnosis not present

## 2020-09-15 DIAGNOSIS — H25013 Cortical age-related cataract, bilateral: Secondary | ICD-10-CM | POA: Diagnosis not present

## 2020-09-21 DIAGNOSIS — R519 Headache, unspecified: Secondary | ICD-10-CM | POA: Diagnosis not present

## 2020-09-21 DIAGNOSIS — F419 Anxiety disorder, unspecified: Secondary | ICD-10-CM | POA: Diagnosis not present

## 2020-09-21 DIAGNOSIS — S022XXD Fracture of nasal bones, subsequent encounter for fracture with routine healing: Secondary | ICD-10-CM | POA: Diagnosis not present

## 2020-09-30 DIAGNOSIS — H25811 Combined forms of age-related cataract, right eye: Secondary | ICD-10-CM | POA: Diagnosis not present

## 2020-09-30 DIAGNOSIS — H2511 Age-related nuclear cataract, right eye: Secondary | ICD-10-CM | POA: Diagnosis not present

## 2020-10-05 DIAGNOSIS — H25012 Cortical age-related cataract, left eye: Secondary | ICD-10-CM | POA: Diagnosis not present

## 2020-10-05 DIAGNOSIS — H2512 Age-related nuclear cataract, left eye: Secondary | ICD-10-CM | POA: Diagnosis not present

## 2020-10-14 DIAGNOSIS — H2512 Age-related nuclear cataract, left eye: Secondary | ICD-10-CM | POA: Diagnosis not present

## 2020-10-14 DIAGNOSIS — H25812 Combined forms of age-related cataract, left eye: Secondary | ICD-10-CM | POA: Diagnosis not present

## 2020-10-14 DIAGNOSIS — H25012 Cortical age-related cataract, left eye: Secondary | ICD-10-CM | POA: Diagnosis not present

## 2020-10-22 ENCOUNTER — Other Ambulatory Visit: Payer: Self-pay | Admitting: *Deleted

## 2020-10-22 ENCOUNTER — Encounter: Payer: Self-pay | Admitting: *Deleted

## 2020-10-23 ENCOUNTER — Encounter: Payer: Self-pay | Admitting: Neurology

## 2020-10-23 ENCOUNTER — Ambulatory Visit (INDEPENDENT_AMBULATORY_CARE_PROVIDER_SITE_OTHER): Payer: Medicare Other | Admitting: Neurology

## 2020-10-23 ENCOUNTER — Other Ambulatory Visit: Payer: Self-pay

## 2020-10-23 VITALS — BP 127/78 | HR 81 | Ht 65.0 in | Wt 187.0 lb

## 2020-10-23 DIAGNOSIS — R202 Paresthesia of skin: Secondary | ICD-10-CM

## 2020-10-23 DIAGNOSIS — Z9289 Personal history of other medical treatment: Secondary | ICD-10-CM | POA: Diagnosis not present

## 2020-10-23 NOTE — Progress Notes (Signed)
Reason for visit: Sensory alteration, legs  Referring physician: Dr. Hyacinth Meeker is a 70 y.o. female  History of present illness:  [name] is a 70 year old right-handed white female with a history of prediabetes in the past, with recent improvement in the hemoglobin A1c to 5.6.  The patient comes to the office with a history of onset around January 2022 with some burning sensations that were noted in the anterior portion of the lower extremities bilaterally.  This sensation was felt to be consistent with a sunburn type feeling that would generally occur when she was an active such as sitting or lying down in bed.  The sensation was not present when she was up and active.  The sensation came on bilaterally from the knee to the ankle, and did not involve the foot on either side.  The issue has gradually improved to the point where she is only getting this sensation once or twice a week briefly.  She has not noted any weakness of the lower extremities or any changes in balance.  She denies any sensory alterations on the arms, she denies any back pain or neck pain.  She denies any difficulty controlling the bowels or the bladder.  She was given a prescription for gabapentin previously, but this resulted in significant side effects and she stopped the medication.  She is sleeping well currently.  She has never had any similar sensory alterations previously.  She is sent to this office for further evaluation.  She has undergone blood work that included a vitamin B12 level of 297.  Past Medical History:  Diagnosis Date   Anxiety    Arthritis    GERD (gastroesophageal reflux disease)    Hypercholesteremia    Osteoporosis    Pneumonia    Pre-diabetes     Past Surgical History:  Procedure Laterality Date   CESAREAN SECTION     x2    FINGER SURGERY     rt little finger trigger    FOOT SURGERY     right   foot  tendon   PLANTAR FASCIA RELEASE     left   SHOULDER ARTHROSCOPY WITH  SUBACROMIAL DECOMPRESSION AND BICEP TENDON REPAIR Left 06/12/2015   Procedure: LEFT SHOULDER ARTHROSCOPY WITH DEBRIDEMENT OF SLAP, SUBACROMIAL DECOMPRESSION AND BICEP TENODESIS;  Surgeon: Netta Cedars, MD;  Location: [facility];  Service: Orthopedics;  Laterality: Left;   TOTAL KNEE ARTHROPLASTY Left 07/18/2018   Procedure: TOTAL KNEE ARTHROPLASTY;  Surgeon: [name], MD;  Location: [facility];  Service: Orthopedics;  Laterality: Left;  151min   TOTAL KNEE ARTHROPLASTY Right 05/25/2020   Procedure: TOTAL KNEE ARTHROPLASTY;  Surgeon: Gaynelle Arabian, MD;  Location: [facility];  Service: Orthopedics;  Laterality: Right;  40min    Family History  Problem Relation Age of Onset   Diabetes Mother    Hypertension Mother    Breast cancer Mother    Alcoholism Father    Diabetes Sister    Cancer Maternal Grandfather     Social history:  reports that she has never smoked. She has never used smokeless tobacco. She reports that she does not drink alcohol and does not use drugs.  Medications:  Prior to Admission medications   Medication Sig Start Date End Date Taking? Authorizing Provider  acetaminophen (TYLENOL) 325 MG tablet Take 650 mg by mouth every 6 (six) hours as needed.   Yes [provider]  meloxicam (MOBIC) 15 MG tablet Take 1 tablet by mouth daily.  09/16/20  Yes [provider]  Multiple Vitamins-Minerals (MULTIVITAMIN WITH MINERALS) tablet Take 1 tablet by mouth daily.   Yes [provider]  pantoprazole (PROTONIX) 40 MG tablet Take 40 mg by mouth daily.   Yes [provider]  Probiotic Product (PROBIOTIC DAILY PO) Take 1 capsule by mouth daily.   Yes [provider]  rosuvastatin (CRESTOR) 20 MG tablet Take 20 mg by mouth daily.   Yes [provider]  sertraline (ZOLOFT) 100 MG tablet Take 100 mg by mouth in the morning and at bedtime.   Yes [provider]  sertraline (ZOLOFT) 100 MG tablet Take 100 mg by mouth in the morning and at  bedtime.   Yes [provider]      Allergies  Allergen Reactions   Celecoxib Anaphylaxis   Atorvastatin Other (See Comments)    Memory changes   Bupropion     Other reaction(s): blurred vision and anxiousness   Cepacol  [Benzocaine-Menthol] Nausea Only   Ciprofloxacin     Other reaction(s): ? reaction can not remember   Citalopram Hydrobromide     Other reaction(s): didn't work, wt gain   Oxycodone     Other reaction(s): does not want to take    ROS:  Out of a complete 14 system review of symptoms, the patient complains only of the following symptoms, and all other reviewed systems are negative.  Sensory alteration, legs Occasional headache  Blood pressure 127/78, pulse 81, height 5\' 5"  (1.651 m), weight 187 lb (84.8 kg).  Physical Exam  General: The patient is alert and cooperative at the time of the examination.  Eyes: Pupils are equal, round, and reactive to light. Discs are flat bilaterally.  Neck: The neck is supple, no carotid bruits are noted.  Respiratory: The respiratory examination is clear.  Cardiovascular: The cardiovascular examination reveals a regular rate and rhythm, no obvious murmurs or rubs are noted.  Skin: Extremities are without significant edema.  Neurologic Exam  Mental status: The patient is alert and oriented x 3 at the time of the examination. The patient has apparent normal recent and remote memory, with an apparently normal attention span and concentration ability.  Cranial nerves: Facial symmetry is present. There is good sensation of the face to pinprick and soft touch bilaterally. The strength of the facial muscles and the muscles to head turning and shoulder shrug are normal bilaterally. Speech is well enunciated, no aphasia or dysarthria is noted. Extraocular movements are full. Visual fields are full. The tongue is midline, and the patient has symmetric elevation of the soft palate. No obvious hearing deficits are  noted.  Motor: The motor testing reveals 5 over 5 strength of all 4 extremities. Good symmetric motor tone is noted throughout.  Sensory: Sensory testing is intact to pinprick, soft touch, vibration sensation, and position sense on all 4 extremities. No evidence of extinction is noted.  Coordination: Cerebellar testing reveals good finger-nose-finger and heel-to-shin bilaterally.  Gait and station: Gait is normal. Tandem gait is normal. Romberg is negative. No drift is seen.  The patient is able to walk on the heels and the toes bilaterally.  Reflexes: Deep tendon reflexes are symmetric and normal bilaterally.  Ankle jerk reflexes are trace bilaterally.  Toes are downgoing bilaterally.   Assessment/Plan:  1.  Subjective sensory alteration, bilateral legs  The history described by the patient does not appear to be fully consistent with onset of a typical peripheral neuropathy.  She had a sunburn feeling  in the pretibial areas excluding the feet coming on bilaterally, noticeable only with inactivity.  She is improving over time.  The etiology of this is not clear.  We will send her for further blood work but I will have the patient self monitor.  If the symptoms worsen or change in any way, she is to contact our office.  We may consider further evaluation at that time.  Melissa Alexanders MD 10/23/2020 9:25 AM  [facility] [address] [place], [place] [address]  Phone [phone] Fax [phone] , Lactic legs that nothing Ampyra family, do not have all of the labs okay so as well as long as

## 2020-10-26 DIAGNOSIS — S022XXA Fracture of nasal bones, initial encounter for closed fracture: Secondary | ICD-10-CM | POA: Diagnosis not present

## 2020-10-26 LAB — ENA+DNA/DS+SJORGEN'S
ENA RNP Ab: <0.2 AI (ref 0.0–0.9)
ENA SM Ab Ser-aCnc: <0.2 AI (ref 0.0–0.9)
ENA SSA (RO) Ab: <0.2 AI (ref 0.0–0.9)
ENA SSB (LA) Ab: <0.2 AI (ref 0.0–0.9)
dsDNA Ab: <1 IU/mL (ref 0–9)

## 2020-10-26 LAB — SEDIMENTATION RATE: Sed Rate: 9 mm/hr (ref 0–40)

## 2020-10-26 LAB — COPPER, SERUM: Copper: 113 ug/dL (ref 80–158)

## 2020-10-26 LAB — ANA W/REFLEX: Anti Nuclear Antibody (ANA): POSITIVE — AB

## 2020-10-26 LAB — LYME DISEASE SEROLOGY W/REFLEX: Lyme Total Antibody EIA: NEGATIVE

## 2020-10-26 LAB — ANGIOTENSIN CONVERTING ENZYME: Angio Convert Enzyme: 45 U/L (ref 14–82)

## 2020-11-19 DIAGNOSIS — R519 Headache, unspecified: Secondary | ICD-10-CM | POA: Diagnosis not present

## 2020-11-19 DIAGNOSIS — R0981 Nasal congestion: Secondary | ICD-10-CM | POA: Diagnosis not present

## 2020-12-30 ENCOUNTER — Ambulatory Visit (INDEPENDENT_AMBULATORY_CARE_PROVIDER_SITE_OTHER): Payer: Medicare Other | Admitting: Otolaryngology

## 2021-01-07 DIAGNOSIS — H35033 Hypertensive retinopathy, bilateral: Secondary | ICD-10-CM | POA: Diagnosis not present

## 2021-01-07 DIAGNOSIS — E782 Mixed hyperlipidemia: Secondary | ICD-10-CM | POA: Diagnosis not present

## 2021-01-07 DIAGNOSIS — F329 Major depressive disorder, single episode, unspecified: Secondary | ICD-10-CM | POA: Diagnosis not present

## 2021-01-07 DIAGNOSIS — G4700 Insomnia, unspecified: Secondary | ICD-10-CM | POA: Diagnosis not present

## 2021-01-07 DIAGNOSIS — D649 Anemia, unspecified: Secondary | ICD-10-CM | POA: Diagnosis not present

## 2021-01-07 DIAGNOSIS — K219 Gastro-esophageal reflux disease without esophagitis: Secondary | ICD-10-CM | POA: Diagnosis not present

## 2021-01-24 ENCOUNTER — Emergency Department (HOSPITAL_BASED_OUTPATIENT_CLINIC_OR_DEPARTMENT_OTHER): Payer: Medicare Other

## 2021-01-24 ENCOUNTER — Other Ambulatory Visit: Payer: Self-pay

## 2021-01-24 ENCOUNTER — Encounter (HOSPITAL_BASED_OUTPATIENT_CLINIC_OR_DEPARTMENT_OTHER): Payer: Self-pay | Admitting: *Deleted

## 2021-01-24 ENCOUNTER — Emergency Department (HOSPITAL_BASED_OUTPATIENT_CLINIC_OR_DEPARTMENT_OTHER)
Admission: EM | Admit: 2021-01-24 | Discharge: 2021-01-24 | Disposition: A | Discharge status: Home / Self Care | Payer: Medicare Other | Attending: Emergency Medicine | Admitting: Emergency Medicine

## 2021-01-24 DIAGNOSIS — Y92512 Supermarket, store or market as the place of occurrence of the external cause: Secondary | ICD-10-CM | POA: Diagnosis not present

## 2021-01-24 DIAGNOSIS — S92911A Unspecified fracture of right toe(s), initial encounter for closed fracture: Secondary | ICD-10-CM

## 2021-01-24 DIAGNOSIS — Z96653 Presence of artificial knee joint, bilateral: Secondary | ICD-10-CM | POA: Insufficient documentation

## 2021-01-24 DIAGNOSIS — S52502A Unspecified fracture of the lower end of left radius, initial encounter for closed fracture: Secondary | ICD-10-CM

## 2021-01-24 DIAGNOSIS — S6992XA Unspecified injury of left wrist, hand and finger(s), initial encounter: Secondary | ICD-10-CM | POA: Diagnosis present

## 2021-01-24 DIAGNOSIS — Z043 Encounter for examination and observation following other accident: Secondary | ICD-10-CM | POA: Diagnosis not present

## 2021-01-24 DIAGNOSIS — S0083XA Contusion of other part of head, initial encounter: Secondary | ICD-10-CM | POA: Diagnosis not present

## 2021-01-24 DIAGNOSIS — M47812 Spondylosis without myelopathy or radiculopathy, cervical region: Secondary | ICD-10-CM | POA: Diagnosis not present

## 2021-01-24 DIAGNOSIS — S92534A Nondisplaced fracture of distal phalanx of right lesser toe(s), initial encounter for closed fracture: Secondary | ICD-10-CM | POA: Diagnosis not present

## 2021-01-24 DIAGNOSIS — M25531 Pain in right wrist: Secondary | ICD-10-CM | POA: Diagnosis not present

## 2021-01-24 DIAGNOSIS — W1839XA Other fall on same level, initial encounter: Secondary | ICD-10-CM | POA: Insufficient documentation

## 2021-01-24 DIAGNOSIS — M7989 Other specified soft tissue disorders: Secondary | ICD-10-CM | POA: Diagnosis not present

## 2021-01-24 DIAGNOSIS — M79671 Pain in right foot: Secondary | ICD-10-CM | POA: Diagnosis not present

## 2021-01-24 DIAGNOSIS — S92424A Nondisplaced fracture of distal phalanx of right great toe, initial encounter for closed fracture: Secondary | ICD-10-CM | POA: Diagnosis not present

## 2021-01-24 DIAGNOSIS — S0003XA Contusion of scalp, initial encounter: Secondary | ICD-10-CM | POA: Diagnosis not present

## 2021-01-24 DIAGNOSIS — W19XXXA Unspecified fall, initial encounter: Secondary | ICD-10-CM

## 2021-01-24 MED ORDER — ACETAMINOPHEN 325 MG PO TABS
650.0000 mg | ORAL_TABLET | Freq: Once | ORAL | Status: AC
  Administered 2021-01-24: 650 mg via ORAL
  Filled 2021-01-24: qty 2

## 2021-01-24 NOTE — Discharge Instructions (Addendum)
Rotate tylenol and motrin for pain  Follow up with Dr. Stann Mainland for further treatment of your wrist fracture  Please follow up with your primary care provider within 5-7 days for re-evaluation of your symptoms. If you do not have a primary care provider, information for a healthcare clinic has been provided for you to make arrangements for follow up care. Please return to the emergency department for any new or worsening symptoms.

## 2021-01-24 NOTE — ED Provider Notes (Addendum)
[facility] EMERGENCY DEPT Provider Note   CSN: [id number] Arrival date & time: 01/24/21  1306     History Chief Complaint  Patient presents with   Fall   Head Injury   Wrist Injury   Foot Pain    [name] is a 70 y.o. female.  HPI  70 year old female with history of anxiety, arthritis, GERD, hypercholesterolemia, osteoporosis, pneumonia, who presents to the emergency department today for evaluation after a fall.  States she was at [facility] when she went to step and lost her footing.  She fell forward hurting her right foot.  She also caught herself with her wrist and is complaining of left wrist pain.  She did hit her head and has some swelling to the left eyebrow area.  She denies loss of consciousness or vomiting after her fall.  She denies any neck or back pain.  Has been ambulatory since the fall.  She is not anticoagulated.  Her Tdap is up-to-date.  Past Medical History:  Diagnosis Date   Anxiety    Arthritis    GERD (gastroesophageal reflux disease)    Hypercholesteremia    Osteoporosis    Pneumonia    Pre-diabetes     Patient Active Problem List   Diagnosis Date Noted   Osteoarthritis of right knee 05/25/2020   Pain in left knee 08/15/2018   H/O total knee replacement, left 07/18/2018   Trigger thumb of left hand 04/23/2018   Aftercare 11/09/2017   Radial styloid tenosynovitis of left hand 09/07/2017   Osteoarthritis of left knee 08/03/2017    Past Surgical History:  Procedure Laterality Date   CESAREAN SECTION     x2    FINGER SURGERY     rt little finger trigger    FOOT SURGERY     right   foot  tendon   PLANTAR FASCIA RELEASE     left   SHOULDER ARTHROSCOPY WITH SUBACROMIAL DECOMPRESSION AND BICEP TENDON REPAIR Left 06/12/2015   Procedure: LEFT SHOULDER ARTHROSCOPY WITH DEBRIDEMENT OF SLAP, SUBACROMIAL DECOMPRESSION AND BICEP TENODESIS;  Surgeon: Netta Cedars, MD;  Location: [facility];  Service: Orthopedics;  Laterality: Left;   TOTAL  KNEE ARTHROPLASTY Left 07/18/2018   Procedure: TOTAL KNEE ARTHROPLASTY;  Surgeon: [name], MD;  Location: [facility];  Service: Orthopedics;  Laterality: Left;  154min   TOTAL KNEE ARTHROPLASTY Right 05/25/2020   Procedure: TOTAL KNEE ARTHROPLASTY;  Surgeon: Gaynelle Arabian, MD;  Location: [facility];  Service: Orthopedics;  Laterality: Right;  86min     OB History   No obstetric history on file.     Family History  Problem Relation Age of Onset   Diabetes Mother    Hypertension Mother    Breast cancer Mother    Alcoholism Father    Diabetes Sister    Cancer Maternal Grandfather     Social History   Tobacco Use   Smoking status: Never   Smokeless tobacco: Never  Vaping Use   Vaping Use: Never used  Substance Use Topics   Alcohol use: No   Drug use: No    Home Medications Prior to Admission medications   Medication Sig Start Date End Date Taking? Authorizing Provider  acetaminophen (TYLENOL) 325 MG tablet Take 650 mg by mouth every 6 (six) hours as needed.    [provider]  meloxicam (MOBIC) 15 MG tablet Take 1 tablet by mouth daily. 09/16/20   [provider]  Multiple Vitamins-Minerals (MULTIVITAMIN WITH MINERALS) tablet Take 1 tablet  by mouth daily.    [provider]  pantoprazole (PROTONIX) 40 MG tablet Take 40 mg by mouth daily.    [provider]  Probiotic Product (PROBIOTIC DAILY PO) Take 1 capsule by mouth daily.    [provider]  rosuvastatin (CRESTOR) 20 MG tablet Take 20 mg by mouth daily.    [provider]  sertraline (ZOLOFT) 100 MG tablet Take 100 mg by mouth in the morning and at bedtime.    [provider]    Allergies    Celecoxib, Atorvastatin, Bupropion, Cepacol  [benzocaine-menthol], Ciprofloxacin, Citalopram hydrobromide, and Oxycodone  Review of Systems   Review of Systems  Constitutional:  Negative for chills and fever.  HENT:  Negative for ear pain and sore throat.   Eyes:   Negative for pain and visual disturbance.  Respiratory:  Negative for cough and shortness of breath.   Cardiovascular:  Negative for chest pain.  Gastrointestinal:  Negative for abdominal pain and vomiting.  Genitourinary:  Negative for dysuria and hematuria.  Musculoskeletal:  Negative for arthralgias and back pain.  Skin:  Negative for color change and rash.  Neurological:        Head injury, no loc  All other systems reviewed and are negative.  Physical Exam Updated Vital Signs BP 139/82 (BP Location: Left Arm)   Pulse 78   Temp 98 F (36.7 C) (Oral)   Resp 17   Ht 5\' 6"  (1.676 m)   Wt 84.8 kg   SpO2 98%   BMI 30.18 kg/m   Physical Exam Vitals and nursing note reviewed.  Constitutional:      General: She is not in acute distress.    Appearance: She is well-developed.  HENT:     Head: Normocephalic.     Comments: Large hematoma to the left eyebrow area    Nose: Nose normal.     Mouth/Throat:     Mouth: Mucous membranes are moist.  Eyes:     Extraocular Movements: Extraocular movements intact.     Conjunctiva/sclera: Conjunctivae normal.     Pupils: Pupils are equal, round, and reactive to light.     Comments: No entrapment  Cardiovascular:     Rate and Rhythm: Normal rate and regular rhythm.     Heart sounds: No murmur heard. Pulmonary:     Effort: Pulmonary effort is normal. No respiratory distress.     Breath sounds: Normal breath sounds.  Abdominal:     Palpations: Abdomen is soft.     Tenderness: There is no abdominal tenderness.  Musculoskeletal:     Cervical back: Neck supple.     Comments: No TTP to the cervical, thoracic or lumbar spine. TTP to the left wrist and to the base of the right great toe. Radial pulse intact. Brisk cap refill to all fingers of the left hand  Skin:    General: Skin is warm and dry.  Neurological:     Mental Status: She is alert.     Comments: Mental Status:  Alert, thought content appropriate, able to give a coherent  history. Speech fluent without evidence of aphasia. Able to follow 2 step commands without difficulty.  Cranial Nerves:  II:  pupils equal, round, reactive to light III,IV, VI: ptosis not present, extra-ocular motions intact bilaterally  V,VII: smile symmetric, facial light touch sensation equal VIII: hearing grossly normal to voice  X: uvula elevates symmetrically  XI: bilateral shoulder shrug symmetric and strong XII: midline tongue extension without fassiculations Motor:  Normal tone. 5/5 strength of BUE and BLE major muscle groups Sensory: light touch normal in all extremities.     ED Results / Procedures / Treatments   Labs (all labs ordered are listed, but only abnormal results are displayed) Labs Reviewed - No data to display  EKG None  Radiology DG Wrist Complete Left  Result Date: 01/24/2021 CLINICAL DATA:  Fall.  Right wrist pain. EXAM: LEFT WRIST - COMPLETE 4 VIEW COMPARISON:  None. FINDINGS: Acute, nondisplaced, non comminuted fracture across the base of the radial styloid, intersecting the articular surface. No other acute fractures. Old ulnar styloid fracture versus an ununited ulnar styloid. Joints are normally aligned. Joint space narrowing, subchondral sclerosis and marginal osteophytes at the trapezium first metacarpal articulation consistent with osteoarthritis. Dorsal radial soft tissue swelling. IMPRESSION: 1. Nondisplaced, non comminuted fracture of the distal radius, across the base of the radial styloid, intersecting the distal radial articular surface. 2. No other fractures.  No dislocation. Electronically Signed   By: Lajean Manes M.D.   On: 01/24/2021 14:25   CT HEAD WO CONTRAST (5MM)  Result Date: 01/24/2021 CLINICAL DATA:  Forehead injury after fall today. EXAM: CT HEAD WITHOUT CONTRAST CT MAXILLOFACIAL WITHOUT CONTRAST CT CERVICAL SPINE WITHOUT CONTRAST TECHNIQUE: Multidetector CT imaging of the head, cervical spine, and maxillofacial structures were  performed using the standard protocol without intravenous contrast. Multiplanar CT image reconstructions of the cervical spine and maxillofacial structures were also generated. COMPARISON:  August 27, 2020. FINDINGS: CT HEAD FINDINGS Brain: No evidence of acute infarction, hemorrhage, hydrocephalus, extra-axial collection or mass lesion/mass effect. Vascular: No hyperdense vessel or unexpected calcification. Skull: Normal. Negative for fracture or focal lesion. Other: Large left frontal scalp hematoma is noted. CT MAXILLOFACIAL FINDINGS Osseous: No fracture or mandibular dislocation. No destructive process. Orbits: Negative. No traumatic or inflammatory finding. Sinuses: Clear. Soft tissues: Negative. CT CERVICAL SPINE FINDINGS Alignment: Normal. Skull base and vertebrae: No acute fracture. No primary bone lesion or focal pathologic process. Soft tissues and spinal canal: No prevertebral fluid or swelling. No visible canal hematoma. Disc levels: Mild anterior osteophyte formation is noted at C3-4, C4-5, C5-6 and C6-7. Upper chest: Negative. Other: Mild degenerative changes are seen involving the posterior facet joints bilaterally. IMPRESSION: Large left frontal scalp hematoma. No acute intracranial abnormality seen. No definite abnormality seen in maxillofacial region. Mild multilevel degenerative changes are noted in the cervical spine. No acute abnormality is noted. Electronically Signed   By: Marijo Conception M.D.   On: 01/24/2021 14:29   CT Cervical Spine Wo Contrast  Result Date: 01/24/2021 CLINICAL DATA:  Forehead injury after fall today. EXAM: CT HEAD WITHOUT CONTRAST CT MAXILLOFACIAL WITHOUT CONTRAST CT CERVICAL SPINE WITHOUT CONTRAST TECHNIQUE: Multidetector CT imaging of the head, cervical spine, and maxillofacial structures were performed using the standard protocol without intravenous contrast. Multiplanar CT image reconstructions of the cervical spine and maxillofacial structures were also generated.  COMPARISON:  August 27, 2020. FINDINGS: CT HEAD FINDINGS Brain: No evidence of acute infarction, hemorrhage, hydrocephalus, extra-axial collection or mass lesion/mass effect. Vascular: No hyperdense vessel or unexpected calcification. Skull: Normal. Negative for fracture or focal lesion. Other: Large left frontal scalp hematoma is noted. CT MAXILLOFACIAL FINDINGS Osseous: No fracture or mandibular dislocation. No destructive process. Orbits: Negative. No traumatic or inflammatory finding. Sinuses: Clear. Soft tissues: Negative. CT CERVICAL SPINE FINDINGS Alignment: Normal. Skull base and vertebrae: No acute fracture. No primary bone lesion or focal pathologic process. Soft tissues and spinal canal: No prevertebral fluid or  swelling. No visible canal hematoma. Disc levels: Mild anterior osteophyte formation is noted at C3-4, C4-5, C5-6 and C6-7. Upper chest: Negative. Other: Mild degenerative changes are seen involving the posterior facet joints bilaterally. IMPRESSION: Large left frontal scalp hematoma. No acute intracranial abnormality seen. No definite abnormality seen in maxillofacial region. Mild multilevel degenerative changes are noted in the cervical spine. No acute abnormality is noted. Electronically Signed   By: Marijo Conception M.D.   On: 01/24/2021 14:29   DG Foot Complete Right  Result Date: 01/24/2021 CLINICAL DATA:  Fall.  Right foot pain. EXAM: RIGHT FOOT COMPLETE - 3 VIEW COMPARISON:  None. FINDINGS: Subtle nondisplaced, non comminuted fracture of the distal second metatarsal. No other fractures. Joints are normally aligned. Haglund deformity from the dorsal calcaneus. Dorsal and plantar calcaneal spurs. To surgical tacks lie within the calcaneal tuberosity. Mild forefoot soft tissue swelling. IMPRESSION: 1. Subtle nondisplaced, non comminuted fracture of the distal second metatarsal. 2. No other fractures.  No dislocation. Electronically Signed   By: Lajean Manes M.D.   On: 01/24/2021 14:27    CT Maxillofacial Wo Contrast  Result Date: 01/24/2021 CLINICAL DATA:  Forehead injury after fall today. EXAM: CT HEAD WITHOUT CONTRAST CT MAXILLOFACIAL WITHOUT CONTRAST CT CERVICAL SPINE WITHOUT CONTRAST TECHNIQUE: Multidetector CT imaging of the head, cervical spine, and maxillofacial structures were performed using the standard protocol without intravenous contrast. Multiplanar CT image reconstructions of the cervical spine and maxillofacial structures were also generated. COMPARISON:  August 27, 2020. FINDINGS: CT HEAD FINDINGS Brain: No evidence of acute infarction, hemorrhage, hydrocephalus, extra-axial collection or mass lesion/mass effect. Vascular: No hyperdense vessel or unexpected calcification. Skull: Normal. Negative for fracture or focal lesion. Other: Large left frontal scalp hematoma is noted. CT MAXILLOFACIAL FINDINGS Osseous: No fracture or mandibular dislocation. No destructive process. Orbits: Negative. No traumatic or inflammatory finding. Sinuses: Clear. Soft tissues: Negative. CT CERVICAL SPINE FINDINGS Alignment: Normal. Skull base and vertebrae: No acute fracture. No primary bone lesion or focal pathologic process. Soft tissues and spinal canal: No prevertebral fluid or swelling. No visible canal hematoma. Disc levels: Mild anterior osteophyte formation is noted at C3-4, C4-5, C5-6 and C6-7. Upper chest: Negative. Other: Mild degenerative changes are seen involving the posterior facet joints bilaterally. IMPRESSION: Large left frontal scalp hematoma. No acute intracranial abnormality seen. No definite abnormality seen in maxillofacial region. Mild multilevel degenerative changes are noted in the cervical spine. No acute abnormality is noted. Electronically Signed   By: Marijo Conception M.D.   On: 01/24/2021 14:29    Procedures Procedures  SPLINT APPLICATION Date/Time: 3:08 PM Authorized by: Rodney Booze Consent: Verbal consent obtained. Risks and benefits: risks, benefits and  alternatives were discussed Consent given by: patient Splint applied by: technician Location details: LUE Splint type: sugar tong splint Supplies used: orthoglass and ace wrap Post-procedure: The splinted body part was neurovascularly unchanged following the procedure. Patient tolerance: Patient tolerated the procedure well with no immediate complications.    Medications Ordered in ED Medications  acetaminophen (TYLENOL) tablet 650 mg (has no administration in time range)    ED Course  I have reviewed the triage vital signs and the nursing notes.  Pertinent labs & imaging results that were available during my care of the patient were reviewed by me and considered in my medical decision making (see chart for details).    MDM Rules/Calculators/A&P  70 year old female presents the emergency department today for evaluation of mechanical fall that occurred prior to arrival while she was in the parking lot at [place].  She sustained head trauma but did not lose consciousness.  Her neurologic exam is within normal limits. Imaging was personally reviewed/interpreted.  CT head/cervical spine/maxillofacial does not show any intracranial bleeding, skull fracture, cervical spine fracture, dislocation or other emergent traumatic findings.  X-ray of the left wrist - 1. Nondisplaced, non comminuted fracture of the distal radius, across the base of the radial styloid, intersecting the distal radial articular surface. 2. No other fractures.  No dislocation. Xray right foot -  1. Subtle nondisplaced, non comminuted fracture of the distal second metatarsal. 2. No other fractures.  No dislocation.  Patient was placed in a wrist splint and postop shoe. She was given f/u with orthopedics. Advised on return precautions. She voices understanding of the plan and reasons to return. All questions answered, pt stable for discharge   Final Clinical Impression(s) / ED Diagnoses Final  diagnoses:  Fall, initial encounter  Closed fracture of distal end of left radius, unspecified fracture morphology, initial encounter  Closed nondisplaced fracture of phalanx of toe of right foot, unspecified toe, initial encounter    Rx / DC Orders ED Discharge Orders     None        Rodney Booze, PA-C 01/24/21 1443    Rodney Booze, PA-C 01/24/21 1444    Wyvonnia Dusky, MD 01/24/21 1718

## 2021-01-24 NOTE — ED Triage Notes (Signed)
Pt tripped over curb at [facility] today, large hematoma to left eye and forehead, bil writs pain with swelling to left to left wrist.No LOC

## 2021-02-01 DIAGNOSIS — M25532 Pain in left wrist: Secondary | ICD-10-CM | POA: Insufficient documentation

## 2021-02-02 DIAGNOSIS — S52512A Displaced fracture of left radial styloid process, initial encounter for closed fracture: Secondary | ICD-10-CM | POA: Diagnosis not present

## 2021-02-06 DIAGNOSIS — Z23 Encounter for immunization: Secondary | ICD-10-CM | POA: Diagnosis not present

## 2021-02-10 DIAGNOSIS — K219 Gastro-esophageal reflux disease without esophagitis: Secondary | ICD-10-CM | POA: Diagnosis not present

## 2021-02-10 DIAGNOSIS — F325 Major depressive disorder, single episode, in full remission: Secondary | ICD-10-CM | POA: Diagnosis not present

## 2021-02-10 DIAGNOSIS — E782 Mixed hyperlipidemia: Secondary | ICD-10-CM | POA: Diagnosis not present

## 2021-02-10 DIAGNOSIS — H35033 Hypertensive retinopathy, bilateral: Secondary | ICD-10-CM | POA: Diagnosis not present

## 2021-02-10 DIAGNOSIS — F329 Major depressive disorder, single episode, unspecified: Secondary | ICD-10-CM | POA: Diagnosis not present

## 2021-02-10 DIAGNOSIS — G4700 Insomnia, unspecified: Secondary | ICD-10-CM | POA: Diagnosis not present

## 2021-02-10 DIAGNOSIS — D649 Anemia, unspecified: Secondary | ICD-10-CM | POA: Diagnosis not present

## 2021-02-12 DIAGNOSIS — Z1231 Encounter for screening mammogram for malignant neoplasm of breast: Secondary | ICD-10-CM | POA: Diagnosis not present

## 2021-02-18 DIAGNOSIS — F419 Anxiety disorder, unspecified: Secondary | ICD-10-CM | POA: Diagnosis not present

## 2021-02-18 DIAGNOSIS — S0083XA Contusion of other part of head, initial encounter: Secondary | ICD-10-CM | POA: Diagnosis not present

## 2021-02-18 DIAGNOSIS — Z9181 History of falling: Secondary | ICD-10-CM | POA: Diagnosis not present

## 2021-02-18 DIAGNOSIS — F329 Major depressive disorder, single episode, unspecified: Secondary | ICD-10-CM | POA: Diagnosis not present

## 2021-02-18 DIAGNOSIS — S62102D Fracture of unspecified carpal bone, left wrist, subsequent encounter for fracture with routine healing: Secondary | ICD-10-CM | POA: Diagnosis not present

## 2021-02-25 DIAGNOSIS — M25532 Pain in left wrist: Secondary | ICD-10-CM | POA: Diagnosis not present

## 2021-04-01 DIAGNOSIS — F325 Major depressive disorder, single episode, in full remission: Secondary | ICD-10-CM | POA: Diagnosis not present

## 2021-05-03 DIAGNOSIS — Z961 Presence of intraocular lens: Secondary | ICD-10-CM | POA: Diagnosis not present

## 2021-05-03 DIAGNOSIS — H26493 Other secondary cataract, bilateral: Secondary | ICD-10-CM | POA: Diagnosis not present

## 2021-05-03 DIAGNOSIS — H35372 Puckering of macula, left eye: Secondary | ICD-10-CM | POA: Diagnosis not present

## 2021-05-03 DIAGNOSIS — H04123 Dry eye syndrome of bilateral lacrimal glands: Secondary | ICD-10-CM | POA: Diagnosis not present

## 2021-05-13 DIAGNOSIS — G4700 Insomnia, unspecified: Secondary | ICD-10-CM | POA: Diagnosis not present

## 2021-05-13 DIAGNOSIS — F325 Major depressive disorder, single episode, in full remission: Secondary | ICD-10-CM | POA: Diagnosis not present

## 2021-05-13 DIAGNOSIS — K219 Gastro-esophageal reflux disease without esophagitis: Secondary | ICD-10-CM | POA: Diagnosis not present

## 2021-05-13 DIAGNOSIS — E782 Mixed hyperlipidemia: Secondary | ICD-10-CM | POA: Diagnosis not present

## 2021-06-17 DIAGNOSIS — M129 Arthropathy, unspecified: Secondary | ICD-10-CM | POA: Diagnosis not present

## 2021-06-17 DIAGNOSIS — Z0000 Encounter for general adult medical examination without abnormal findings: Secondary | ICD-10-CM | POA: Diagnosis not present

## 2021-06-17 DIAGNOSIS — R7303 Prediabetes: Secondary | ICD-10-CM | POA: Diagnosis not present

## 2021-06-17 DIAGNOSIS — Z1389 Encounter for screening for other disorder: Secondary | ICD-10-CM | POA: Diagnosis not present

## 2021-06-17 DIAGNOSIS — K219 Gastro-esophageal reflux disease without esophagitis: Secondary | ICD-10-CM | POA: Diagnosis not present

## 2021-06-17 DIAGNOSIS — F419 Anxiety disorder, unspecified: Secondary | ICD-10-CM | POA: Diagnosis not present

## 2021-06-17 DIAGNOSIS — R946 Abnormal results of thyroid function studies: Secondary | ICD-10-CM | POA: Diagnosis not present

## 2021-06-17 DIAGNOSIS — Z1159 Encounter for screening for other viral diseases: Secondary | ICD-10-CM | POA: Diagnosis not present

## 2021-06-17 DIAGNOSIS — E782 Mixed hyperlipidemia: Secondary | ICD-10-CM | POA: Diagnosis not present

## 2021-06-17 DIAGNOSIS — F325 Major depressive disorder, single episode, in full remission: Secondary | ICD-10-CM | POA: Diagnosis not present

## 2021-07-01 ENCOUNTER — Encounter: Payer: Self-pay | Admitting: Neurology

## 2021-07-01 ENCOUNTER — Ambulatory Visit (INDEPENDENT_AMBULATORY_CARE_PROVIDER_SITE_OTHER): Payer: Medicare Other | Admitting: Neurology

## 2021-07-01 VITALS — BP 148/89 | HR 80 | Ht 66.0 in | Wt 196.0 lb

## 2021-07-01 DIAGNOSIS — Z860100 Personal history of colon polyps, unspecified: Secondary | ICD-10-CM | POA: Insufficient documentation

## 2021-07-01 DIAGNOSIS — M129 Arthropathy, unspecified: Secondary | ICD-10-CM | POA: Insufficient documentation

## 2021-07-01 DIAGNOSIS — G4700 Insomnia, unspecified: Secondary | ICD-10-CM | POA: Insufficient documentation

## 2021-07-01 DIAGNOSIS — F325 Major depressive disorder, single episode, in full remission: Secondary | ICD-10-CM | POA: Insufficient documentation

## 2021-07-01 DIAGNOSIS — K219 Gastro-esophageal reflux disease without esophagitis: Secondary | ICD-10-CM | POA: Insufficient documentation

## 2021-07-01 DIAGNOSIS — R7989 Other specified abnormal findings of blood chemistry: Secondary | ICD-10-CM | POA: Insufficient documentation

## 2021-07-01 DIAGNOSIS — R202 Paresthesia of skin: Secondary | ICD-10-CM

## 2021-07-01 DIAGNOSIS — E782 Mixed hyperlipidemia: Secondary | ICD-10-CM | POA: Insufficient documentation

## 2021-07-01 DIAGNOSIS — R209 Unspecified disturbances of skin sensation: Secondary | ICD-10-CM | POA: Insufficient documentation

## 2021-07-01 DIAGNOSIS — F329 Major depressive disorder, single episode, unspecified: Secondary | ICD-10-CM | POA: Insufficient documentation

## 2021-07-01 DIAGNOSIS — F419 Anxiety disorder, unspecified: Secondary | ICD-10-CM | POA: Insufficient documentation

## 2021-07-01 DIAGNOSIS — F5104 Psychophysiologic insomnia: Secondary | ICD-10-CM

## 2021-07-01 DIAGNOSIS — J309 Allergic rhinitis, unspecified: Secondary | ICD-10-CM | POA: Insufficient documentation

## 2021-07-01 DIAGNOSIS — R7303 Prediabetes: Secondary | ICD-10-CM | POA: Insufficient documentation

## 2021-07-01 DIAGNOSIS — D649 Anemia, unspecified: Secondary | ICD-10-CM | POA: Insufficient documentation

## 2021-07-01 DIAGNOSIS — H35039 Hypertensive retinopathy, unspecified eye: Secondary | ICD-10-CM | POA: Insufficient documentation

## 2021-07-01 DIAGNOSIS — M8580 Other specified disorders of bone density and structure, unspecified site: Secondary | ICD-10-CM | POA: Insufficient documentation

## 2021-07-01 DIAGNOSIS — K5900 Constipation, unspecified: Secondary | ICD-10-CM | POA: Insufficient documentation

## 2021-07-01 MED ORDER — GABAPENTIN 300 MG PO CAPS: 300.0000 mg | ORAL_CAPSULE | Freq: Every day | ORAL | 3 refills | Status: AC

## 2021-07-01 NOTE — Progress Notes (Signed)
Chief Complaint  Patient presents with   New Patient (Initial Visit)    Rm 15. Alone. NX [facility] [employer]/Candace Smith MD [facility]/Persistent head pain post fall. C/o stinging and burning on left frontal side of head and left upper cheekbone. Denies headaches.      ASSESSMENT AND PLAN  AYSLIN KUNDERT is a 71 y.o. female   Left facial pain,  Following fall and significant soft tissue trauma in September 2022, the described left facial pain, are in the distribution of left supraorbital and infraorbital territory, most likely related to nerve tic irritation from significant soft tissue trauma  She also complains of long history of chronic insomnia, lower extremity paresthesia intermittently, history of bilateral knee replacement  Will try low-dose gabapentin 300 mg as needed   DIAGNOSTIC DATA (LABS, IMAGING, TESTING) - I reviewed patient records, labs, notes, testing and imaging myself where available. Laboratory evaluation in 2022, negative double-stranded DNA, Sjogren antibody, normal copper, angiotensin-converting enzyme, Lyme disease, ESR, positive ANA, normal CMP, CBC, hemoglobin of 13.4, creatinine of 0.75, TSH was mildly elevated 6.34 in January 2022, LDL was 84  Passing out spells leading to emergency evaluation August 27, 2020  CT of the brain showed large subcutaneous tissue over the left anterior frontal and subarticular region swelling, no acute abnormality, cervical spine mild degenerative changes no acute abnormality,  CT of the face no fracture identified  MEDICAL HISTORY:  [name], is a 71 year old female, seen in request by her primary care doctor Carol Ada, for evaluation of left frontal and zygomatic area pain following her fall, facial trauma, initial evaluation was on July 01, 2021.  I reviewed and summarized the referring note.PMHX HLD Depression, anxiety GERD Bilateral Knee replacement  She had 2 accident fall in 2022, the first  1 was in April striking her forehead region, recovered very well, she suffered a more significant fall in September 2022, landed on her left face, with right first toe, left wrist fracture  I personally reviewed CT head and maxillary, cervical on January 24, 2021, large left frontal scalp hematoma, no acute intracranial abnormality, multilevel cervical degenerative changes, no acute abnormality  Her left facial swelling gradually improved, however to this today, she still have sensitivity, at left forehead region in the distribution of left superior orbital nerve, and left zygomatic region in the distribution of left inferior orbital nerve, there is still soft tissue tenderness, thickening, with deep palpitation, can trigger numb tingling sensation  She denies visual loss, no hearing loss  At baseline, she denies gait abnormality, denies bowel and bladder incontinence, those falls are by accident   She also complains of long history of intermittent difficulty sleeping, sometimes wake up feeling lower extremity paresthesia, does have a history of bilateral knee replacement, denies persistent lower extremity sensory or motor deficit PHYSICAL EXAM:   Vitals:   07/01/21 0828  BP: (!) 148/89  Pulse: 80  Weight: 196 lb (88.9 kg)  Height: $Remove'5\' 6"'mAOTwqd$  (1.676 m)   Not recorded     Body mass index is 31.64 kg/m.  PHYSICAL EXAMNIATION:  Gen: NAD, conversant, well nourised, well groomed                     Cardiovascular: Regular rate rhythm, no peripheral edema, warm, nontender. Eyes: Conjunctivae clear without exudates or hemorrhage Neck: Supple, no carotid bruits. Pulmonary: Clear to auscultation bilaterally   NEUROLOGICAL EXAM:  MENTAL STATUS: Speech:    Speech is normal; fluent and spontaneous  with normal comprehension.  Cognition:     Orientation to time, place and person     Normal recent and remote memory     Normal Attention span and concentration     Normal Language, naming,  repeating,spontaneous speech     Fund of knowledge   CRANIAL NERVES: CN II: Visual fields are full to confrontation. Pupils are round equal and briskly reactive to light. CN III, IV, VI: extraocular movement are normal. No ptosis. CN V: Facial sensation is intact to light touch CN VII: She has decreased the wrinkle and left forehead supraorbital region, soft tissue tenderness upon deep palpitation, occasionally trigger shooting discomfort, left superior orbital nerve, mild tenderness at left zygomatic region, CN VIII: Hearing is normal to causal conversation. CN IX, X: Phonation is normal. CN XI: Head turning and shoulder shrug are intact  MOTOR: There is no pronator drift of out-stretched arms. Muscle bulk and tone are normal. Muscle strength is normal.  REFLEXES: Reflexes are 2+ and symmetric at the biceps, triceps, knees, and ankles. Plantar responses are flexor.  SENSORY: Intact to light touch, pinprick and vibratory sensation are intact in fingers and toes.  COORDINATION: There is no trunk or limb dysmetria noted.  GAIT/STANCE: Posture is normal. Gait is steady with normal steps, base, arm swing, and turning. Heel and toe walking are normal. Tandem gait is normal.  Romberg is absent.  REVIEW OF SYSTEMS:  Full 14 system review of systems performed and notable only for as above All other review of systems were negative.   ALLERGIES: Allergies  Allergen Reactions   Celecoxib Anaphylaxis   Atorvastatin Other (See Comments)    Memory changes   Bupropion     Other reaction(s): blurred vision and anxiousness   Cepacol  [Benzocaine-Menthol] Nausea Only   Ciprofloxacin     Other reaction(s): ? reaction can not remember   Citalopram Hydrobromide     Other reaction(s): didn't work, wt gain   Oxycodone     Other reaction(s): does not want to take    HOME MEDICATIONS: Current Outpatient Medications  Medication Sig Dispense Refill   acetaminophen (TYLENOL) 325 MG tablet  Take 650 mg by mouth every 6 (six) hours as needed.     meloxicam (MOBIC) 15 MG tablet Take 1 tablet by mouth daily.     Multiple Vitamins-Minerals (MULTIVITAMIN WITH MINERALS) tablet Take 1 tablet by mouth daily.     pantoprazole (PROTONIX) 40 MG tablet Take 40 mg by mouth daily.     Probiotic Product (PROBIOTIC DAILY PO) Take 1 capsule by mouth daily.     rosuvastatin (CRESTOR) 20 MG tablet Take 20 mg by mouth daily.     sertraline (ZOLOFT) 100 MG tablet Take 100 mg by mouth in the morning and at bedtime.     No current facility-administered medications for this visit.    PAST MEDICAL HISTORY: Past Medical History:  Diagnosis Date   Anxiety    Arthritis    GERD (gastroesophageal reflux disease)    Hypercholesteremia    Osteoporosis    Pneumonia    Pre-diabetes     PAST SURGICAL HISTORY: Past Surgical History:  Procedure Laterality Date   CESAREAN SECTION     x2    FINGER SURGERY     rt little finger trigger    FOOT SURGERY     right   foot  tendon   PLANTAR FASCIA RELEASE     left   SHOULDER ARTHROSCOPY WITH SUBACROMIAL DECOMPRESSION AND BICEP TENDON  REPAIR Left 06/12/2015   Procedure: LEFT SHOULDER ARTHROSCOPY WITH DEBRIDEMENT OF SLAP, SUBACROMIAL DECOMPRESSION AND BICEP TENODESIS;  Surgeon: Netta Cedars, MD;  Location: [facility];  Service: Orthopedics;  Laterality: Left;   TOTAL KNEE ARTHROPLASTY Left 07/18/2018   Procedure: TOTAL KNEE ARTHROPLASTY;  Surgeon: [name], MD;  Location: [facility];  Service: Orthopedics;  Laterality: Left;  133min   TOTAL KNEE ARTHROPLASTY Right 05/25/2020   Procedure: TOTAL KNEE ARTHROPLASTY;  Surgeon: Gaynelle Arabian, MD;  Location: [facility];  Service: Orthopedics;  Laterality: Right;  62min    FAMILY HISTORY: Family History  Problem Relation Age of Onset   Diabetes Mother    Hypertension Mother    Breast cancer Mother    Alcoholism Father    Diabetes Sister    Cancer Maternal Grandfather     SOCIAL HISTORY: Social History    Socioeconomic History   Marital status: Married    Spouse name: Sonia Side   Number of children: 2   Years of education: 12   Highest education level: Not on file  Occupational History   Not on file  Tobacco Use   Smoking status: Never   Smokeless tobacco: Never  Vaping Use   Vaping Use: Never used  Substance and Sexual Activity   Alcohol use: No   Drug use: No   Sexual activity: Not Currently  Other Topics Concern   Not on file  Social History Narrative   Lives with husband   Social Determinants of Health   Financial Resource Strain: Not on file  Food Insecurity: Not on file  Transportation Needs: Not on file  Physical Activity: Not on file  Stress: Not on file  Social Connections: Not on file  Intimate Partner Violence: Not on file      Marcial Pacas, M.D. Ph.D.  [facility] [address], [place], [place] [address] Ph: [phone] Fax: [phone]  CC:  Carol Ada, [place],  [place] [address]  Carol Ada, MD

## 2021-07-29 DIAGNOSIS — Z96651 Presence of right artificial knee joint: Secondary | ICD-10-CM | POA: Diagnosis not present

## 2021-08-23 ENCOUNTER — Telehealth: Payer: Self-pay | Admitting: Neurology

## 2021-08-23 NOTE — Telephone Encounter (Signed)
Pt states at last appt Dr. Krista Blue recommended if gabapentin (NEURONTIN) 300 MG capsule worked well, PCP should continue filling medication.  ?Pt requesting that gabapentin (NEURONTIN) 300 MG capsule to refill be sent to her PCP Carol Ada, MD.  ?

## 2021-08-23 NOTE — Telephone Encounter (Signed)
Spoke with pt and instructed her to contact PCP to manage refills. Patient verbalized understanding and expressed appreciation for the call. ?

## 2021-08-23 NOTE — Telephone Encounter (Signed)
We will need to call the patient back on [phone].  ? ?She should contact her PCP directly to request refills to be managed by that office. ?

## 2021-09-22 DIAGNOSIS — R946 Abnormal results of thyroid function studies: Secondary | ICD-10-CM | POA: Diagnosis not present

## 2021-11-15 DIAGNOSIS — K5900 Constipation, unspecified: Secondary | ICD-10-CM | POA: Diagnosis not present

## 2021-12-14 DIAGNOSIS — R42 Dizziness and giddiness: Secondary | ICD-10-CM | POA: Diagnosis not present

## 2021-12-14 DIAGNOSIS — R946 Abnormal results of thyroid function studies: Secondary | ICD-10-CM | POA: Diagnosis not present

## 2021-12-14 DIAGNOSIS — R030 Elevated blood-pressure reading, without diagnosis of hypertension: Secondary | ICD-10-CM | POA: Diagnosis not present

## 2021-12-21 DIAGNOSIS — K219 Gastro-esophageal reflux disease without esophagitis: Secondary | ICD-10-CM | POA: Diagnosis not present

## 2021-12-21 DIAGNOSIS — E782 Mixed hyperlipidemia: Secondary | ICD-10-CM | POA: Diagnosis not present

## 2021-12-21 DIAGNOSIS — F419 Anxiety disorder, unspecified: Secondary | ICD-10-CM | POA: Diagnosis not present

## 2021-12-21 DIAGNOSIS — K5900 Constipation, unspecified: Secondary | ICD-10-CM | POA: Diagnosis not present

## 2021-12-21 DIAGNOSIS — M129 Arthropathy, unspecified: Secondary | ICD-10-CM | POA: Diagnosis not present

## 2022-01-07 DIAGNOSIS — M533 Sacrococcygeal disorders, not elsewhere classified: Secondary | ICD-10-CM | POA: Diagnosis not present

## 2022-01-24 DIAGNOSIS — Z23 Encounter for immunization: Secondary | ICD-10-CM | POA: Diagnosis not present

## 2022-02-18 DIAGNOSIS — Z1231 Encounter for screening mammogram for malignant neoplasm of breast: Secondary | ICD-10-CM | POA: Diagnosis not present

## 2022-03-29 DIAGNOSIS — R059 Cough, unspecified: Secondary | ICD-10-CM | POA: Diagnosis not present

## 2022-03-29 DIAGNOSIS — Z03818 Encounter for observation for suspected exposure to other biological agents ruled out: Secondary | ICD-10-CM | POA: Diagnosis not present

## 2022-03-29 DIAGNOSIS — J329 Chronic sinusitis, unspecified: Secondary | ICD-10-CM | POA: Diagnosis not present

## 2022-04-11 DIAGNOSIS — R519 Headache, unspecified: Secondary | ICD-10-CM | POA: Diagnosis not present

## 2022-04-11 DIAGNOSIS — H6991 Unspecified Eustachian tube disorder, right ear: Secondary | ICD-10-CM | POA: Diagnosis not present

## 2022-04-11 DIAGNOSIS — H6121 Impacted cerumen, right ear: Secondary | ICD-10-CM | POA: Diagnosis not present

## 2022-05-05 DIAGNOSIS — H04123 Dry eye syndrome of bilateral lacrimal glands: Secondary | ICD-10-CM | POA: Diagnosis not present

## 2022-05-05 DIAGNOSIS — H35032 Hypertensive retinopathy, left eye: Secondary | ICD-10-CM | POA: Diagnosis not present

## 2022-05-05 DIAGNOSIS — H57813 Brow ptosis, bilateral: Secondary | ICD-10-CM | POA: Diagnosis not present

## 2022-05-05 DIAGNOSIS — H02831 Dermatochalasis of right upper eyelid: Secondary | ICD-10-CM | POA: Diagnosis not present

## 2022-05-09 DIAGNOSIS — R0982 Postnasal drip: Secondary | ICD-10-CM | POA: Diagnosis not present

## 2022-05-09 DIAGNOSIS — J392 Other diseases of pharynx: Secondary | ICD-10-CM | POA: Diagnosis not present

## 2022-05-30 IMAGING — CT CT CERVICAL SPINE W/O CM
4 series · 16 of 33 positions shown, 19 images · non-contrast
Comparison: August 27, 2020.

CLINICAL DATA: Forehead injury after fall today.

EXAM:
CT HEAD WITHOUT CONTRAST
CT MAXILLOFACIAL WITHOUT CONTRAST
CT CERVICAL SPINE WITHOUT CONTRAST
TECHNIQUE: Multidetector CT imaging of the head, cervical spine, and
maxillofacial structures were performed using the standard protocol
without intravenous contrast. Multiplanar CT image reconstructions
of the cervical spine and maxillofacial structures were also
generated.

[Series 4: c spine soft · axial · 0.34mm/px · z∈[-361,-315]mm · 3 of 70 slices shown]
[im 12/70  soft-tissue]
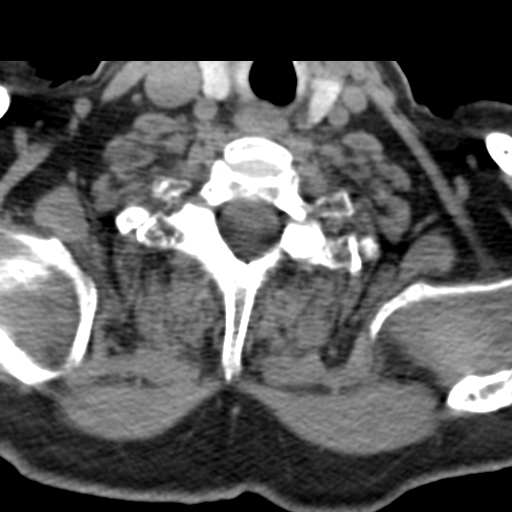
[im 24/70  soft-tissue]
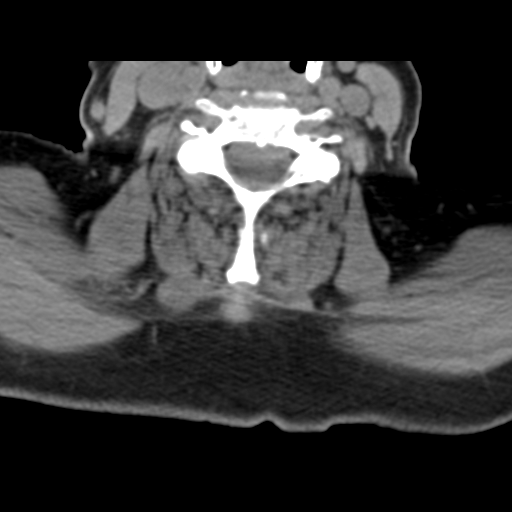
[im 35/70  soft-tissue]
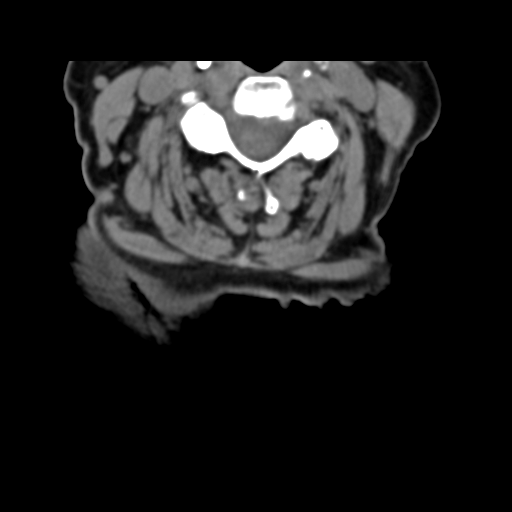

[Series 5: cor bone · coronal · 0.28mm/px · 3 of 66 slices shown]
[im 14/66  bone]
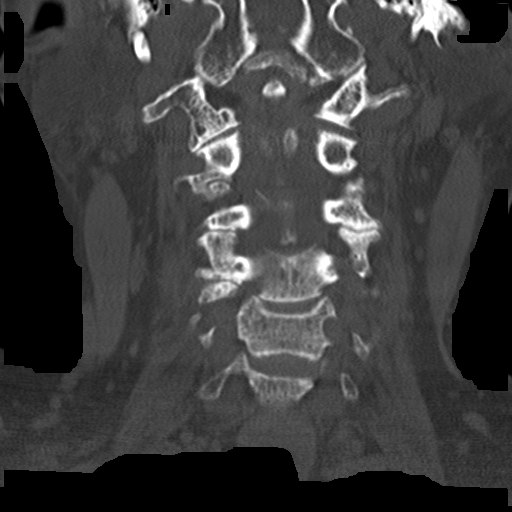
[im 27/66  bone]
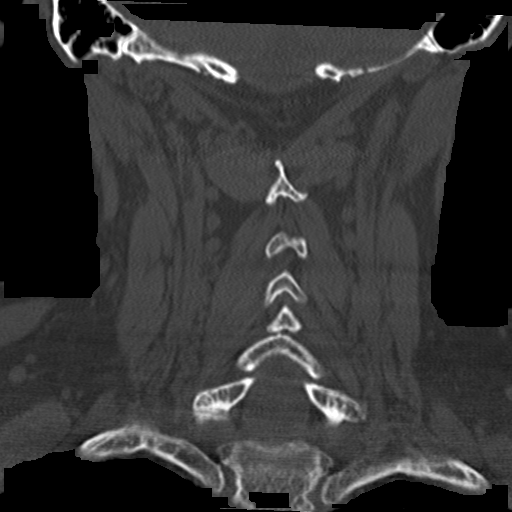
[im 40/66  bone]
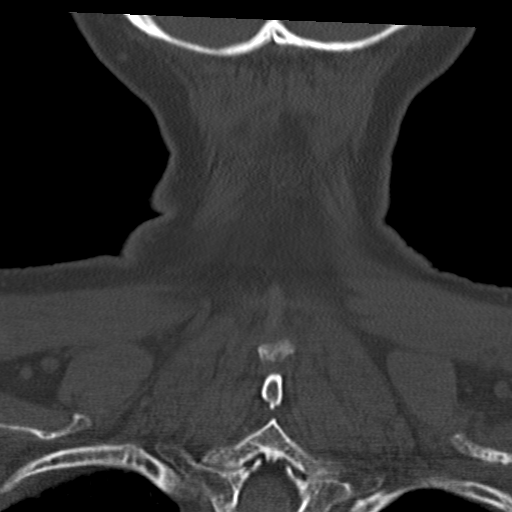

[Series 6: sag bone · sagittal · 0.29mm/px · 5 of 61 slices shown, 6 images]
[im 21/61  bone]
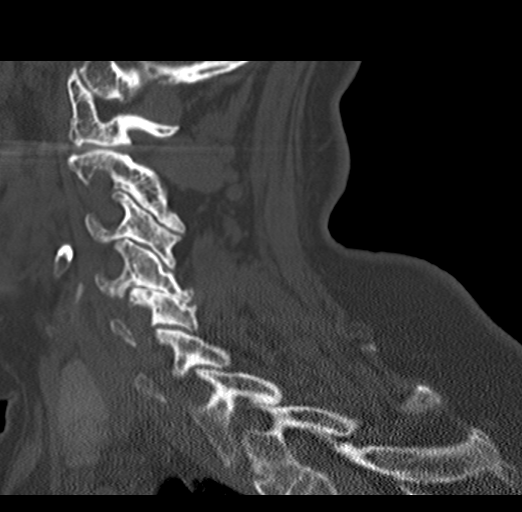
[im 26/61  bone]
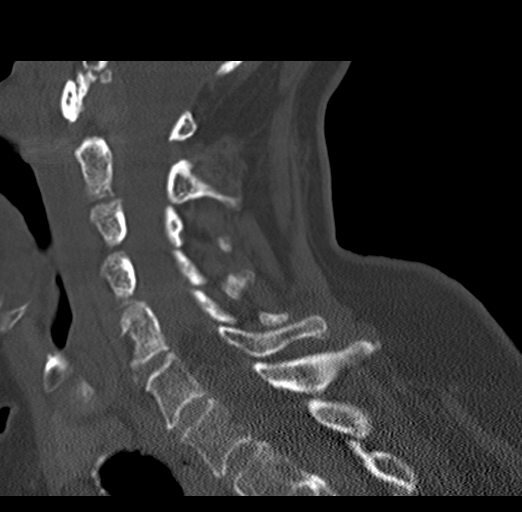
[im 31/61  soft-tissue]
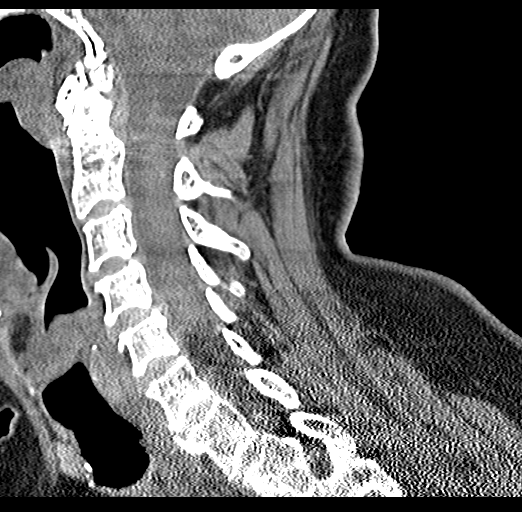
[im 31/61  bone]
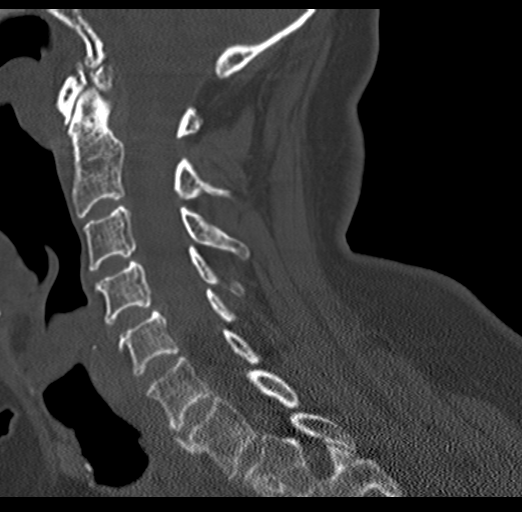
[im 36/61  bone]
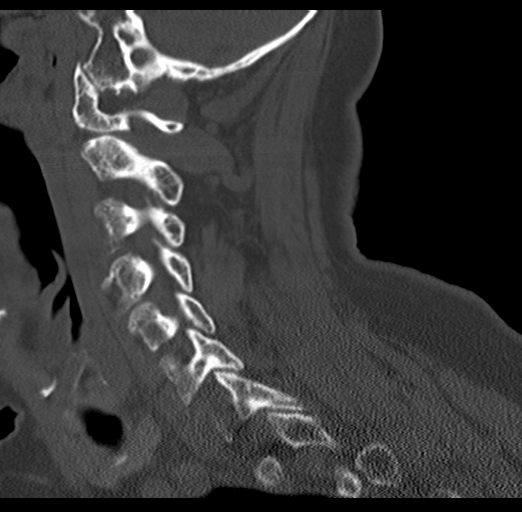
[im 41/61  bone]
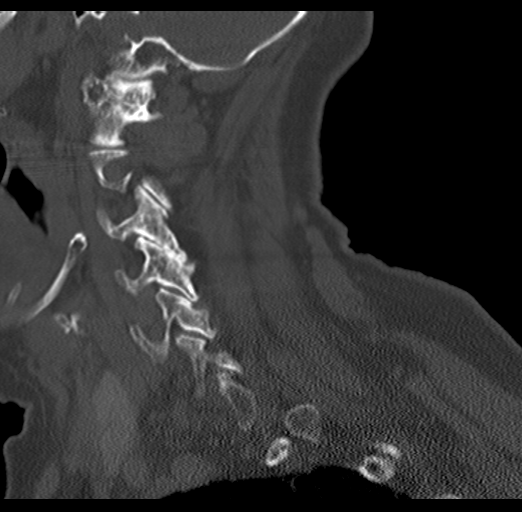

[Series 7: orthogonal axials · oblique · 0.21mm/px · 5 of 78 slices shown, 7 images]
[im 13/78  soft-tissue]
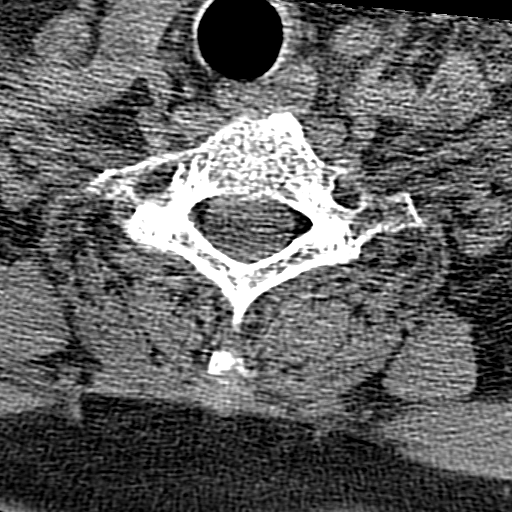
[im 13/78  bone]
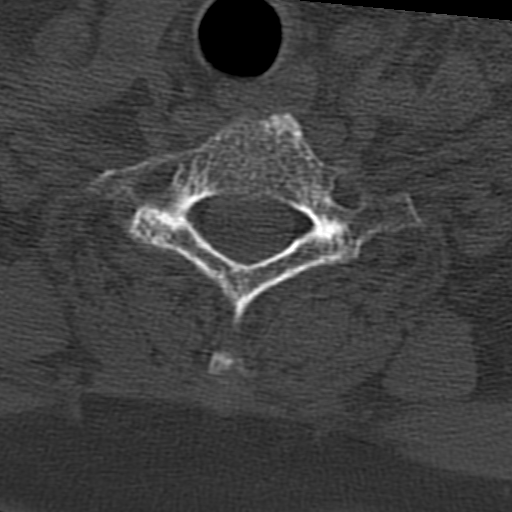
[im 26/78  bone]
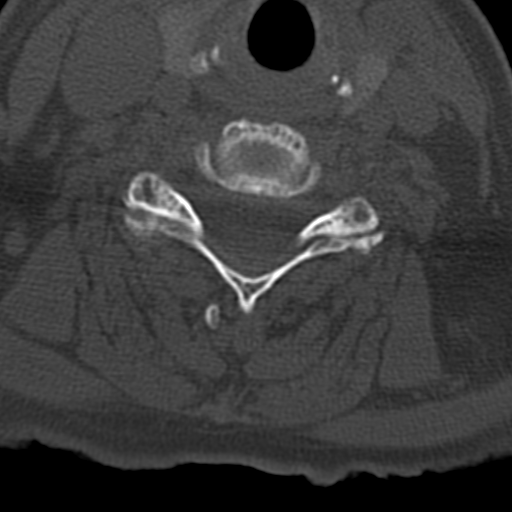
[im 39/78  bone]
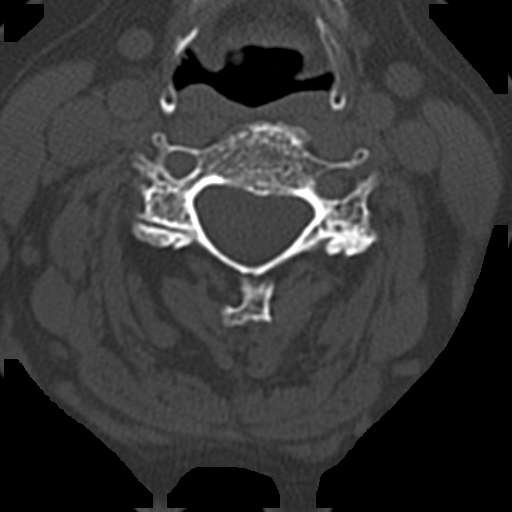
[im 52/78  bone]
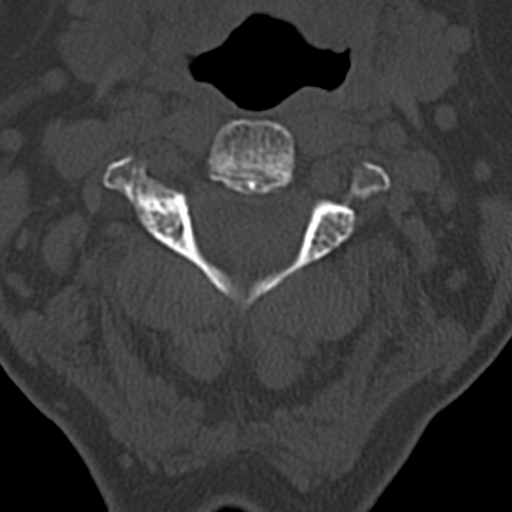
[im 65/78  soft-tissue]
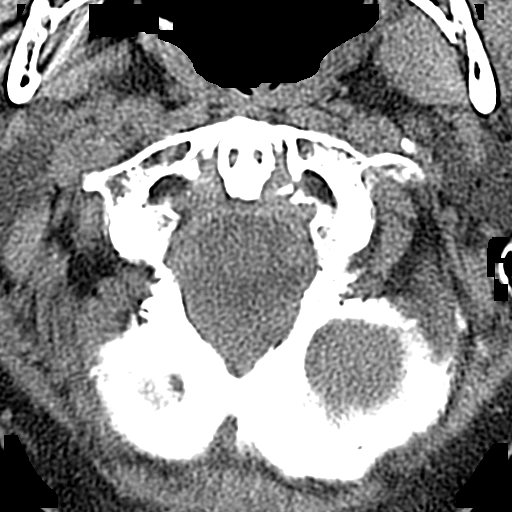
[im 65/78  bone]
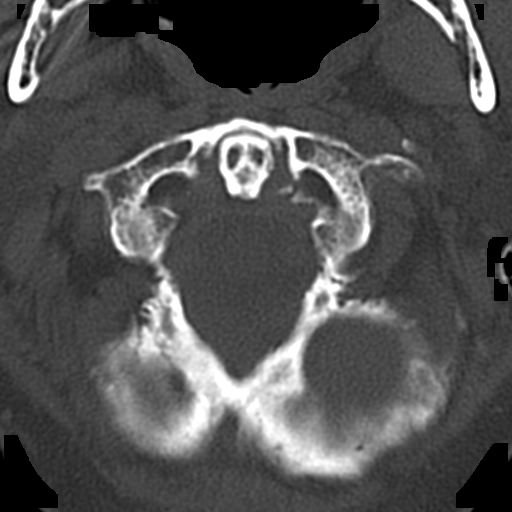

[16 of 33 positions shown; findings below may reference images not displayed]

FINDINGS: CT HEAD FINDINGS

Brain: No evidence of acute infarction, hemorrhage, hydrocephalus,
extra-axial collection or mass lesion/mass effect.

Vascular: No hyperdense vessel or unexpected calcification.

Skull: Normal. Negative for fracture or focal lesion.

Other: Large left frontal scalp hematoma is noted.

CT MAXILLOFACIAL FINDINGS

Osseous: No fracture or mandibular dislocation. No destructive
process.

Orbits: Negative. No traumatic or inflammatory finding.

Sinuses: Clear.

Soft tissues: Negative.

CT CERVICAL SPINE FINDINGS

Alignment: Normal.

Skull base and vertebrae: No acute fracture. No primary bone lesion
or focal pathologic process.

Soft tissues and spinal canal: No prevertebral fluid or swelling. No
visible canal hematoma.

Disc levels: Mild anterior osteophyte formation is noted at C3-4,
C4-5, C5-6 and C6-7.

Upper chest: Negative.

Other: Mild degenerative changes are seen involving the posterior
facet joints bilaterally.
IMPRESSION: Large left frontal scalp hematoma. No acute intracranial abnormality
seen.

No definite abnormality seen in maxillofacial region.

Mild multilevel degenerative changes are noted in the cervical
spine. No acute abnormality is noted.

## 2022-07-05 DIAGNOSIS — M65342 Trigger finger, left ring finger: Secondary | ICD-10-CM | POA: Diagnosis not present

## 2022-07-12 DIAGNOSIS — K219 Gastro-esophageal reflux disease without esophagitis: Secondary | ICD-10-CM | POA: Diagnosis not present

## 2022-07-12 DIAGNOSIS — R7303 Prediabetes: Secondary | ICD-10-CM | POA: Diagnosis not present

## 2022-07-12 DIAGNOSIS — M8589 Other specified disorders of bone density and structure, multiple sites: Secondary | ICD-10-CM | POA: Diagnosis not present

## 2022-07-12 DIAGNOSIS — R946 Abnormal results of thyroid function studies: Secondary | ICD-10-CM | POA: Diagnosis not present

## 2022-07-12 DIAGNOSIS — E782 Mixed hyperlipidemia: Secondary | ICD-10-CM | POA: Diagnosis not present

## 2022-07-12 DIAGNOSIS — Z0000 Encounter for general adult medical examination without abnormal findings: Secondary | ICD-10-CM | POA: Diagnosis not present

## 2022-07-12 DIAGNOSIS — D649 Anemia, unspecified: Secondary | ICD-10-CM | POA: Diagnosis not present

## 2022-07-12 DIAGNOSIS — Z6830 Body mass index (BMI) 30.0-30.9, adult: Secondary | ICD-10-CM | POA: Diagnosis not present

## 2022-07-26 DIAGNOSIS — M8589 Other specified disorders of bone density and structure, multiple sites: Secondary | ICD-10-CM | POA: Diagnosis not present

## 2022-07-28 DIAGNOSIS — M85852 Other specified disorders of bone density and structure, left thigh: Secondary | ICD-10-CM | POA: Diagnosis not present

## 2022-07-28 DIAGNOSIS — M8580 Other specified disorders of bone density and structure, unspecified site: Secondary | ICD-10-CM | POA: Diagnosis not present

## 2022-07-28 DIAGNOSIS — M85851 Other specified disorders of bone density and structure, right thigh: Secondary | ICD-10-CM | POA: Diagnosis not present

## 2022-09-26 ENCOUNTER — Encounter (HOSPITAL_COMMUNITY): Payer: Self-pay

## 2022-09-26 ENCOUNTER — Ambulatory Visit (HOSPITAL_COMMUNITY)
Admission: EM | Admit: 2022-09-26 | Discharge: 2022-09-26 | Disposition: A | Discharge status: Home / Self Care | Payer: Medicare Other | Attending: Physician Assistant | Admitting: Physician Assistant

## 2022-09-26 DIAGNOSIS — L255 Unspecified contact dermatitis due to plants, except food: Secondary | ICD-10-CM | POA: Diagnosis not present

## 2022-09-26 MED ORDER — HYDROXYZINE HCL 10 MG PO TABS: 10.0000 mg | ORAL_TABLET | Freq: Every evening | ORAL | 0 refills | Status: AC | PRN

## 2022-09-26 MED ORDER — PREDNISONE 10 MG (21) PO TBPK: ORAL_TABLET | ORAL | 0 refills | Status: AC

## 2022-09-26 MED ORDER — TRIAMCINOLONE ACETONIDE 0.1 % EX CREA: 1.0000 | TOPICAL_CREAM | Freq: Two times a day (BID) | CUTANEOUS | 0 refills | Status: AC

## 2022-09-26 MED ORDER — METHYLPREDNISOLONE ACETATE 80 MG/ML IJ SUSP
INTRAMUSCULAR | Status: AC
  Filled 2022-09-26: qty 1

## 2022-09-26 MED ORDER — METHYLPREDNISOLONE ACETATE 80 MG/ML IJ SUSP
60.0000 mg | Freq: Once | INTRAMUSCULAR | Status: AC
  Administered 2022-09-26: 60 mg via INTRAMUSCULAR

## 2022-09-26 MED ORDER — HYDROXYZINE HCL 10 MG PO TABS: 25.0000 mg | ORAL_TABLET | Freq: Every evening | ORAL | 0 refills | Status: DC | PRN

## 2022-09-26 NOTE — ED Triage Notes (Signed)
Pt c/o pruritic rash to right arm onset ~ 4 days ago that since has progressed to the left.

## 2022-09-26 NOTE — ED Provider Notes (Signed)
[facility]    CSN: [id number] Arrival date & time: 09/26/22  1139      History   Chief Complaint Chief Complaint  Patient presents with   Rash    HPI [name] is a 72 y.o. female.   Patient presents today with a 3 to 4-day history of intensely pruritic rash on bilateral forearms.  She denies episodes of similar symptoms in the past.  Denies history of dermatological condition including eczema or psoriasis.  She does report that approximately 10 days ago they were cutting down a tree at her church but has not done any additional yard work in the past 2 weeks before symptoms began.  She has tried hydrocortisone cream and calamine lotion without improvement.  She difficulty sleeping last night because of the severity of pruritus.  She denies any swelling of her throat, shortness of breath, muffled voice.  She denies history of diabetes.  Denies any recent steroid use.  Denies any changes to medications or personal hygiene products including soaps or detergents.    Past Medical History:  Diagnosis Date   Anxiety    Arthritis    GERD (gastroesophageal reflux disease)    Hypercholesteremia    Osteoporosis    Pneumonia    Pre-diabetes     Patient Active Problem List   Diagnosis Date Noted   Allergic rhinitis 07/01/2021   Anemia 07/01/2021   Anxiety disorder 07/01/2021   Arthropathy 07/01/2021   Constipation 07/01/2021   Gastro-esophageal reflux disease without esophagitis 07/01/2021   Hypertensive retinopathy 07/01/2021   Insomnia 07/01/2021   Major depression in complete remission (HCC) 07/01/2021   Major depression, single episode 07/01/2021   Mixed hyperlipidemia 07/01/2021   Osteopenia 07/01/2021   Paresthesia 07/01/2021   Personal history of colonic polyps 07/01/2021   Prediabetes 07/01/2021   Raised TSH level 07/01/2021   Skin sensation disturbance 07/01/2021   Chronic insomnia 07/01/2021   Acute pain of left wrist 02/01/2021   Closed fracture  nasal bone 10/26/2020   Osteoarthritis of right knee 05/25/2020   Pain in joint of right knee 03/18/2020   Lumbar facet joint pain 10/07/2019   Lumbar pain 09/10/2019   Pain in left knee 08/15/2018   H/O total knee replacement, left 07/18/2018   Trigger thumb of left hand 04/23/2018   Aftercare 11/09/2017   Radial styloid tenosynovitis of left hand 09/07/2017   Osteoarthritis of left knee 08/03/2017    Past Surgical History:  Procedure Laterality Date   CESAREAN SECTION     x2    FINGER SURGERY     rt little finger trigger    FOOT SURGERY     right   foot  tendon   PLANTAR FASCIA RELEASE     left   SHOULDER ARTHROSCOPY WITH SUBACROMIAL DECOMPRESSION AND BICEP TENDON REPAIR Left 06/12/2015   Procedure: LEFT SHOULDER ARTHROSCOPY WITH DEBRIDEMENT OF SLAP, SUBACROMIAL DECOMPRESSION AND BICEP TENODESIS;  Surgeon: Beverely Low, MD;  Location: [facility];  Service: Orthopedics;  Laterality: Left;   TOTAL KNEE ARTHROPLASTY Left 07/18/2018   Procedure: TOTAL KNEE ARTHROPLASTY;  Surgeon: Ranee Gosselin, MD;  Location: [facility];  Service: Orthopedics;  Laterality: Left;    TOTAL KNEE ARTHROPLASTY Right 05/25/2020   Procedure: TOTAL KNEE ARTHROPLASTY;  Surgeon: Ollen Gross, MD;  Location: [facility];  Service: Orthopedics;  Laterality: Right;     OB History   No obstetric history on file.      Home Medications    Prior to  Admission medications   Medication Sig Start Date End Date Taking? Authorizing Provider  predniSONE (STERAPRED UNI-PAK 21 TAB) 10 MG (21) TBPK tablet As directed 09/26/22  Yes Alesi Zachery K, PA-C  triamcinolone cream (KENALOG) 0.1 % Apply 1 Application topically 2 (two) times daily. 09/26/22  Yes Kaven Cumbie, Noberto Retort, PA-C  acetaminophen (TYLENOL) 325 MG tablet Take 650 mg by mouth every 6 (six) hours as needed.    [provider]  gabapentin (NEURONTIN) 300 MG capsule Take 1 capsule (300 mg total) by mouth at bedtime. 07/01/21   Levert Feinstein, MD  hydrOXYzine  (ATARAX) 10 MG tablet Take 1 tablet (10 mg total) by mouth at bedtime as needed. 09/26/22   Caulin Begley, Noberto Retort, PA-C  meloxicam (MOBIC) 15 MG tablet Take 1 tablet by mouth daily. 09/16/20   [provider]  Multiple Vitamins-Minerals (MULTIVITAMIN WITH MINERALS) tablet Take 1 tablet by mouth daily.    [provider]  pantoprazole (PROTONIX) 40 MG tablet Take 40 mg by mouth daily.    [provider]  Probiotic Product (PROBIOTIC DAILY PO) Take 1 capsule by mouth daily.    [provider]  rosuvastatin (CRESTOR) 20 MG tablet Take 20 mg by mouth daily.    [provider]  sertraline (ZOLOFT) 100 MG tablet Take 100 mg by mouth in the morning and at bedtime.    [provider]    Family History Family History  Problem Relation Age of Onset   Diabetes Mother    Hypertension Mother    Breast cancer Mother    Alcoholism Father    Diabetes Sister    Cancer Maternal Grandfather     Social History Social History   Tobacco Use   Smoking status: Never   Smokeless tobacco: Never  Vaping Use   Vaping Use: Never used  Substance Use Topics   Alcohol use: No   Drug use: No     Allergies   Celecoxib, Atorvastatin, Bupropion, Cepacol  [benzocaine-menthol], Ciprofloxacin, Citalopram hydrobromide, and Oxycodone   Review of Systems Review of Systems  Constitutional:  Positive for activity change. Negative for appetite change, fatigue and fever.  HENT:  Negative for sore throat, trouble swallowing and voice change.   Respiratory:  Negative for cough and shortness of breath.   Cardiovascular:  Negative for chest pain.  Gastrointestinal:  Negative for abdominal pain, diarrhea, nausea and vomiting.  Skin:  Positive for rash. Negative for color change and wound.     Physical Exam Triage Vital Signs ED Triage Vitals [09/26/22 1331]  Enc Vitals Group     BP (!) 158/92     Pulse Rate 85     Resp 18     Temp 98 F (36.7 C)     Temp Source  Oral     SpO2 95 %     Weight      Height      Head Circumference      Peak Flow      Pain Score 0     Pain Loc      Pain Edu?      Excl. in GC?    No data found.  Updated Vital Signs BP (!) 158/92 (BP Location: Right Arm)   Pulse 85   Temp 98 F (36.7 C) (Oral)   Resp 18   SpO2 95%   Visual Acuity Right Eye Distance:   Left Eye Distance:   Bilateral Distance:    Right Eye Near:   Left  Eye Near:    Bilateral Near:     Physical Exam Vitals reviewed.  Constitutional:      General: She is awake. She is not in acute distress.    Appearance: Normal appearance. She is well-developed. She is not ill-appearing.     Comments: Very pleasant female appears stated age in no acute distress sitting comfortably in exam room  HENT:     Head: Normocephalic and atraumatic.     Mouth/Throat:     Pharynx: Uvula midline. No oropharyngeal exudate or posterior oropharyngeal erythema.  Cardiovascular:     Rate and Rhythm: Normal rate and regular rhythm.     Heart sounds: Normal heart sounds, S1 normal and S2 normal. No murmur heard. Pulmonary:     Effort: Pulmonary effort is normal.     Breath sounds: Normal breath sounds. No wheezing, rhonchi or rales.     Comments: Clear to auscultation of bilaterally Skin:    Findings: Rash present. Rash is macular, papular, urticarial and vesicular.     Comments: Maculopapular rash with raised urticarial lesions and associated linear vesicles noted bilateral forearms.  Psychiatric:        Behavior: Behavior is cooperative.      UC Treatments / Results  Labs (all labs ordered are listed, but only abnormal results are displayed) Labs Reviewed - No data to display  EKG   Radiology No results found.  Procedures Procedures (including critical care time)  Medications Ordered in UC Medications  methylPREDNISolone acetate (DEPO-MEDROL) injection 60 mg (60 mg Intramuscular Given 09/26/22 1346)    Initial Impression / Assessment and Plan /  UC Course  I have reviewed the triage vital signs and the nursing notes.  Pertinent labs & imaging results that were available during my care of the patient were reviewed by me and considered in my medical decision making (see chart for details).     Patient is well-appearing, afebrile, nontoxic, nontachycardic.  Concern for Rhus dermatitis given clinical presentation including linear vesicles.  Given severity of symptoms that have not responded to conservative treatment will start systemic steroids.  She was started on Depo-Medrol 60 mg in clinic and will start prednisone taper tomorrow (09/27/2022).  She was instructed to take NSAIDs with this medication due to risk of GI bleeding.  She was given triamcinolone to apply to specific bothersome areas.  Will also start hydroxyzine at night we discussed that this could be sedating and she is not to drive or drink alcohol while taking this.  She needs to take specific measures to avoid falling given associated to sedation.  Discussed that if her symptoms are improving quickly she should return for reevaluation.  If she has any worsening symptoms including rapid spread of rash, fever, swelling of her throat, shortness of breath, nausea, vomiting she needs to be seen immediately.  Strict return precautions given.  Final Clinical Impressions(s) / UC Diagnoses   Final diagnoses:  Rhus dermatitis     Discharge Instructions      We gave the injection of steroids today.  Start prednisone tomorrow (09/27/2022).  Do not take NSAIDs with this medication including aspirin, ibuprofen/Advil, naproxen/Aleve.  Keep this area clean and apply triamcinolone up to twice a day on specific bothersome lesions.  Start hydroxyzine at night.  This can make you sleepy so do not drive or drink alcohol while taking it.  Be very careful to avoid falling so take it just before you go to bed.  Use hypoallergenic soaps and detergents.  If your symptoms are improving quickly please  return for reevaluation.  If anything worsens and you have spread of rash, fever, swelling of your throat, shortness of breath, nausea/vomiting you need to be seen immediately.     ED Prescriptions     Medication Sig Dispense Auth. Provider   predniSONE (STERAPRED UNI-PAK 21 TAB) 10 MG (21) TBPK tablet As directed 21 tablet Lyra Alaimo K, PA-C   triamcinolone cream (KENALOG) 0.1 % Apply 1 Application topically 2 (two) times daily. 30 g Marilee Ditommaso K, PA-C   hydrOXYzine (ATARAX) 10 MG tablet  (Status: Discontinued) Take 2.5 tablets (25 mg total) by mouth at bedtime as needed. 10 tablet Neleh Muldoon K, PA-C   hydrOXYzine (ATARAX) 10 MG tablet Take 1 tablet (10 mg total) by mouth at bedtime as needed. 10 tablet Shahram Alexopoulos, Noberto Retort, PA-C      PDMP not reviewed this encounter.   Jeani Hawking, PA-C 09/26/22 1352

## 2022-09-26 NOTE — Discharge Instructions (Signed)
We gave the injection of steroids today.  Start prednisone tomorrow (09/27/2022).  Do not take NSAIDs with this medication including aspirin, ibuprofen/Advil, naproxen/Aleve.  Keep this area clean and apply triamcinolone up to twice a day on specific bothersome lesions.  Start hydroxyzine at night.  This can make you sleepy so do not drive or drink alcohol while taking it.  Be very careful to avoid falling so take it just before you go to bed.  Use hypoallergenic soaps and detergents.  If your symptoms are improving quickly please return for reevaluation.  If anything worsens and you have spread of rash, fever, swelling of your throat, shortness of breath, nausea/vomiting you need to be seen immediately.

## 2022-10-17 DIAGNOSIS — M25511 Pain in right shoulder: Secondary | ICD-10-CM | POA: Diagnosis not present

## 2022-11-16 DIAGNOSIS — M25511 Pain in right shoulder: Secondary | ICD-10-CM | POA: Diagnosis not present

## 2022-12-06 DIAGNOSIS — M25511 Pain in right shoulder: Secondary | ICD-10-CM | POA: Diagnosis not present

## 2022-12-13 DIAGNOSIS — M75121 Complete rotator cuff tear or rupture of right shoulder, not specified as traumatic: Secondary | ICD-10-CM | POA: Diagnosis not present

## 2022-12-13 DIAGNOSIS — M13811 Other specified arthritis, right shoulder: Secondary | ICD-10-CM | POA: Diagnosis not present

## 2022-12-23 DIAGNOSIS — Z01818 Encounter for other preprocedural examination: Secondary | ICD-10-CM | POA: Diagnosis not present

## 2022-12-23 DIAGNOSIS — M25511 Pain in right shoulder: Secondary | ICD-10-CM | POA: Diagnosis not present

## 2023-01-12 DIAGNOSIS — K219 Gastro-esophageal reflux disease without esophagitis: Secondary | ICD-10-CM | POA: Diagnosis not present

## 2023-01-12 DIAGNOSIS — E782 Mixed hyperlipidemia: Secondary | ICD-10-CM | POA: Diagnosis not present

## 2023-01-12 DIAGNOSIS — F325 Major depressive disorder, single episode, in full remission: Secondary | ICD-10-CM | POA: Diagnosis not present

## 2023-01-12 DIAGNOSIS — R7303 Prediabetes: Secondary | ICD-10-CM | POA: Diagnosis not present

## 2023-01-12 DIAGNOSIS — F419 Anxiety disorder, unspecified: Secondary | ICD-10-CM | POA: Diagnosis not present

## 2023-01-16 DIAGNOSIS — G8918 Other acute postprocedural pain: Secondary | ICD-10-CM | POA: Diagnosis not present

## 2023-01-16 DIAGNOSIS — M19011 Primary osteoarthritis, right shoulder: Secondary | ICD-10-CM | POA: Diagnosis not present

## 2023-01-16 DIAGNOSIS — S46011A Strain of muscle(s) and tendon(s) of the rotator cuff of right shoulder, initial encounter: Secondary | ICD-10-CM | POA: Diagnosis not present

## 2023-01-26 DIAGNOSIS — Z4789 Encounter for other orthopedic aftercare: Secondary | ICD-10-CM | POA: Diagnosis not present

## 2023-02-06 DIAGNOSIS — Z23 Encounter for immunization: Secondary | ICD-10-CM | POA: Diagnosis not present

## 2023-02-07 DIAGNOSIS — M51379 Other intervertebral disc degeneration, lumbosacral region without mention of lumbar back pain or lower extremity pain: Secondary | ICD-10-CM | POA: Diagnosis not present

## 2023-02-07 DIAGNOSIS — M160 Bilateral primary osteoarthritis of hip: Secondary | ICD-10-CM | POA: Diagnosis not present

## 2023-02-07 DIAGNOSIS — M51369 Other intervertebral disc degeneration, lumbar region without mention of lumbar back pain or lower extremity pain: Secondary | ICD-10-CM | POA: Diagnosis not present

## 2023-02-07 DIAGNOSIS — Z1330 Encounter for screening examination for mental health and behavioral disorders, unspecified: Secondary | ICD-10-CM | POA: Diagnosis not present

## 2023-02-07 DIAGNOSIS — M533 Sacrococcygeal disorders, not elsewhere classified: Secondary | ICD-10-CM | POA: Diagnosis not present

## 2023-02-20 DIAGNOSIS — M25611 Stiffness of right shoulder, not elsewhere classified: Secondary | ICD-10-CM | POA: Diagnosis not present

## 2023-02-20 DIAGNOSIS — M25511 Pain in right shoulder: Secondary | ICD-10-CM | POA: Diagnosis not present

## 2023-02-20 DIAGNOSIS — M533 Sacrococcygeal disorders, not elsewhere classified: Secondary | ICD-10-CM | POA: Diagnosis not present

## 2023-02-28 DIAGNOSIS — Z4789 Encounter for other orthopedic aftercare: Secondary | ICD-10-CM | POA: Diagnosis not present

## 2023-05-02 DIAGNOSIS — Z1231 Encounter for screening mammogram for malignant neoplasm of breast: Secondary | ICD-10-CM | POA: Diagnosis not present

## 2023-05-04 DIAGNOSIS — H26493 Other secondary cataract, bilateral: Secondary | ICD-10-CM | POA: Diagnosis not present

## 2023-05-04 DIAGNOSIS — H0100A Unspecified blepharitis right eye, upper and lower eyelids: Secondary | ICD-10-CM | POA: Diagnosis not present

## 2023-05-04 DIAGNOSIS — H04123 Dry eye syndrome of bilateral lacrimal glands: Secondary | ICD-10-CM | POA: Diagnosis not present

## 2023-05-04 DIAGNOSIS — H0100B Unspecified blepharitis left eye, upper and lower eyelids: Secondary | ICD-10-CM | POA: Diagnosis not present

## 2023-05-05 DIAGNOSIS — H538 Other visual disturbances: Secondary | ICD-10-CM | POA: Diagnosis not present

## 2023-05-05 DIAGNOSIS — G44201 Tension-type headache, unspecified, intractable: Secondary | ICD-10-CM | POA: Diagnosis not present

## 2023-05-05 DIAGNOSIS — R42 Dizziness and giddiness: Secondary | ICD-10-CM | POA: Diagnosis not present

## 2023-05-23 DIAGNOSIS — H26492 Other secondary cataract, left eye: Secondary | ICD-10-CM | POA: Diagnosis not present

## 2023-07-26 DIAGNOSIS — Z4789 Encounter for other orthopedic aftercare: Secondary | ICD-10-CM | POA: Diagnosis not present

## 2023-08-07 DIAGNOSIS — E782 Mixed hyperlipidemia: Secondary | ICD-10-CM | POA: Diagnosis not present

## 2023-08-07 DIAGNOSIS — R030 Elevated blood-pressure reading, without diagnosis of hypertension: Secondary | ICD-10-CM | POA: Diagnosis not present

## 2023-08-07 DIAGNOSIS — F419 Anxiety disorder, unspecified: Secondary | ICD-10-CM | POA: Diagnosis not present

## 2023-08-07 DIAGNOSIS — M129 Arthropathy, unspecified: Secondary | ICD-10-CM | POA: Diagnosis not present

## 2023-08-07 DIAGNOSIS — H35033 Hypertensive retinopathy, bilateral: Secondary | ICD-10-CM | POA: Diagnosis not present

## 2023-08-07 DIAGNOSIS — F4321 Adjustment disorder with depressed mood: Secondary | ICD-10-CM | POA: Diagnosis not present

## 2023-08-07 DIAGNOSIS — R7303 Prediabetes: Secondary | ICD-10-CM | POA: Diagnosis not present

## 2023-08-07 DIAGNOSIS — Z23 Encounter for immunization: Secondary | ICD-10-CM | POA: Diagnosis not present

## 2023-08-07 DIAGNOSIS — K219 Gastro-esophageal reflux disease without esophagitis: Secondary | ICD-10-CM | POA: Diagnosis not present

## 2023-08-07 DIAGNOSIS — Z1331 Encounter for screening for depression: Secondary | ICD-10-CM | POA: Diagnosis not present

## 2023-08-07 DIAGNOSIS — Z0000 Encounter for general adult medical examination without abnormal findings: Secondary | ICD-10-CM | POA: Diagnosis not present

## 2023-08-14 DIAGNOSIS — L909 Atrophic disorder of skin, unspecified: Secondary | ICD-10-CM | POA: Diagnosis not present

## 2023-08-29 DIAGNOSIS — L720 Epidermal cyst: Secondary | ICD-10-CM | POA: Diagnosis not present

## 2023-08-29 DIAGNOSIS — L814 Other melanin hyperpigmentation: Secondary | ICD-10-CM | POA: Diagnosis not present

## 2023-08-29 DIAGNOSIS — D1801 Hemangioma of skin and subcutaneous tissue: Secondary | ICD-10-CM | POA: Diagnosis not present

## 2023-08-29 DIAGNOSIS — L821 Other seborrheic keratosis: Secondary | ICD-10-CM | POA: Diagnosis not present

## 2023-09-27 DIAGNOSIS — R110 Nausea: Secondary | ICD-10-CM | POA: Diagnosis not present

## 2023-09-27 DIAGNOSIS — K5900 Constipation, unspecified: Secondary | ICD-10-CM | POA: Diagnosis not present

## 2023-09-27 DIAGNOSIS — R109 Unspecified abdominal pain: Secondary | ICD-10-CM | POA: Diagnosis not present

## 2023-10-17 DIAGNOSIS — M7062 Trochanteric bursitis, left hip: Secondary | ICD-10-CM | POA: Diagnosis not present

## 2023-10-17 DIAGNOSIS — M5459 Other low back pain: Secondary | ICD-10-CM | POA: Diagnosis not present

## 2023-11-29 DIAGNOSIS — R519 Headache, unspecified: Secondary | ICD-10-CM | POA: Diagnosis not present

## 2023-12-06 ENCOUNTER — Other Ambulatory Visit: Payer: Self-pay

## 2023-12-06 ENCOUNTER — Ambulatory Visit (HOSPITAL_COMMUNITY)
Admission: RE | Admit: 2023-12-06 | Discharge: 2023-12-06 | Disposition: A | Discharge status: Home / Self Care | Source: Ambulatory Visit

## 2023-12-06 ENCOUNTER — Encounter (HOSPITAL_COMMUNITY): Payer: Self-pay

## 2023-12-06 VITALS — BP 112/76 | HR 95 | Temp 98.1°F | Resp 18

## 2023-12-06 DIAGNOSIS — R519 Headache, unspecified: Secondary | ICD-10-CM

## 2023-12-06 DIAGNOSIS — J320 Chronic maxillary sinusitis: Secondary | ICD-10-CM

## 2023-12-06 MED ORDER — AMOXICILLIN-POT CLAVULANATE 875-125 MG PO TABS: 1.0000 | ORAL_TABLET | Freq: Two times a day (BID) | ORAL | 0 refills | Status: AC

## 2023-12-06 NOTE — ED Triage Notes (Addendum)
 Headache for 4 weeks.  Has had tylenol , nasal spray, OTC allergy medicine.  Headache on both sides of temporal area.  Denies a runny nose or cough, no fever.  Reports she feels like head is full , behind eyes hurts and left cheek is sore  Sensitivity to light

## 2023-12-06 NOTE — Discharge Instructions (Signed)
 We have sent in an antibiotic to cover for possible sinus infection.  We recommend following up with your primary care provider in the next several days.  If you are unable to get into your primary care doctor's office in the next several days and symptoms are not improving on the antibiotic, please go to the emergency department.  Additionally, please go to the emergency department immediately if you have any worsening of symptoms, severe headache, begin vomiting, have changes or loss of vision, facial drooping, slurred speech, weakness, loss of sensation, or if you have any other concerns.

## 2023-12-06 NOTE — ED Provider Notes (Signed)
 [facility]    CSN: [id number] Arrival date & time: 12/06/23  1321      History   Chief Complaint Chief Complaint  Patient presents with   Headache    HPI GEARLINE SPILMAN is a 73 y.o. female.   Patient is a 73 year old female who presents to the urgent care today with concerns of sinus pain and headache.  She reports that her symptoms began about a month ago.  She reports the headache is present bilaterally in the temporal region.  Additionally, she reports having pain of the maxillary sinus, more present on the left side.  She also reports some mild photosensitivity and some popping of her ears. She has been taking Tylenol  and sinus medication with little improvement in her symptoms.  She denies any fever, history of headaches, severe headache, loss of vision, changes in vision, vomiting, slurred speech, facial drooping, weakness, loss of sensation, head trauma, or other concerns at this time.  Lastly, she does report having cataract surgery and has one eye corrected for close up vision and one eye corrected for distance vision.    Past Medical History:  Diagnosis Date   Anxiety    Arthritis    GERD (gastroesophageal reflux disease)    Hypercholesteremia    Osteoporosis    Pneumonia    Pre-diabetes     Patient Active Problem List   Diagnosis Date Noted   Allergic rhinitis 07/01/2021   Anemia 07/01/2021   Anxiety disorder 07/01/2021   Arthropathy 07/01/2021   Constipation 07/01/2021   Gastro-esophageal reflux disease without esophagitis 07/01/2021   Hypertensive retinopathy 07/01/2021   Insomnia 07/01/2021   Major depression in complete remission (HCC) 07/01/2021   Major depression, single episode 07/01/2021   Mixed hyperlipidemia 07/01/2021   Osteopenia 07/01/2021   Paresthesia 07/01/2021   History of colonic polyps 07/01/2021   Prediabetes 07/01/2021   Raised TSH level 07/01/2021   Skin sensation disturbance 07/01/2021   Chronic insomnia 07/01/2021    Acute pain of left wrist 02/01/2021   Closed fracture nasal bone 10/26/2020   Osteoarthritis of right knee 05/25/2020   Pain in joint of right knee 03/18/2020   Lumbar facet joint pain 10/07/2019   Lumbar pain 09/10/2019   Pain in left knee 08/15/2018   H/O total knee replacement, left 07/18/2018   Trigger thumb of left hand 04/23/2018   Aftercare 11/09/2017   Radial styloid tenosynovitis of left hand 09/07/2017   Osteoarthritis of left knee 08/03/2017    Past Surgical History:  Procedure Laterality Date   CESAREAN SECTION     x2    FINGER SURGERY     rt little finger trigger    FOOT SURGERY     right   foot  tendon   PLANTAR FASCIA RELEASE     left   SHOULDER ARTHROSCOPY WITH SUBACROMIAL DECOMPRESSION AND BICEP TENDON REPAIR Left 06/12/2015   Procedure: LEFT SHOULDER ARTHROSCOPY WITH DEBRIDEMENT OF SLAP, SUBACROMIAL DECOMPRESSION AND BICEP TENODESIS;  Surgeon: Marcey Her, MD;  Location: [facility];  Service: Orthopedics;  Laterality: Left;   TOTAL KNEE ARTHROPLASTY Left 07/18/2018   Procedure: TOTAL KNEE ARTHROPLASTY;  Surgeon: Heide Ingle, MD;  Location: [facility];  Service: Orthopedics;  Laterality: Left;    TOTAL KNEE ARTHROPLASTY Right 05/25/2020   Procedure: TOTAL KNEE ARTHROPLASTY;  Surgeon: Melodi Lerner, MD;  Location: [facility];  Service: Orthopedics;  Laterality: Right;     OB History   No obstetric history on file.  Home Medications    Prior to Admission medications   Medication Sig Start Date End Date Taking? Authorizing Provider  amoxicillin -clavulanate (AUGMENTIN ) 875-125 MG tablet Take 1 tablet by mouth every 12 (twelve) hours. 12/06/23  Yes Melonie Locus, PA-C  gabapentin  (NEURONTIN ) 300 MG capsule Take 1 capsule (300 mg total) by mouth at bedtime. 07/01/21  Yes Onita Duos, MD  acetaminophen  (TYLENOL ) 325 MG tablet Take 650 mg by mouth every 6 (six) hours as needed.    [provider]  hydrOXYzine  (ATARAX ) 10 MG tablet Take 1 tablet (10  mg total) by mouth at bedtime as needed. 09/26/22   Raspet, Erin K, PA-C  meloxicam (MOBIC) 15 MG tablet Take 1 tablet by mouth daily. 09/16/20   [provider]  Multiple Vitamins-Minerals (MULTIVITAMIN WITH MINERALS) tablet Take 1 tablet by mouth daily.    [provider]  pantoprazole (PROTONIX) 40 MG tablet Take 40 mg by mouth daily.    [provider]  predniSONE  (STERAPRED UNI-PAK 21 TAB) 10 MG (21) TBPK tablet As directed 09/26/22   Raspet, Erin K, PA-C  Probiotic Product (PROBIOTIC DAILY PO) Take 1 capsule by mouth daily.    [provider]  rosuvastatin (CRESTOR) 20 MG tablet Take 20 mg by mouth daily.    [provider]  sertraline (ZOLOFT) 100 MG tablet Take 100 mg by mouth in the morning and at bedtime.    [provider]  triamcinolone  cream (KENALOG ) 0.1 % Apply 1 Application topically 2 (two) times daily. 09/26/22   Raspet, Rocky POUR, PA-C    Family History Family History  Problem Relation Age of Onset   Diabetes Mother    Hypertension Mother    Breast cancer Mother    Alcoholism Father    Diabetes Sister    Cancer Maternal Grandfather     Social History Social History   Tobacco Use   Smoking status: Never   Smokeless tobacco: Never  Vaping Use   Vaping status: Never Used  Substance Use Topics   Alcohol use: No   Drug use: No     Allergies   Celecoxib, Atorvastatin, Bupropion, Cepacol  [benzocaine-menthol ], Ciprofloxacin, Citalopram hydrobromide, and Oxycodone    Review of Systems Review of Systems See HPI for relevant ROS.  Physical Exam Triage Vital Signs ED Triage Vitals  Encounter Vitals Group     BP 12/06/23 1340 112/76     Girls Systolic BP Percentile --      Girls Diastolic BP Percentile --      Boys Systolic BP Percentile --      Boys Diastolic BP Percentile --      Pulse Rate 12/06/23 1340 95     Resp 12/06/23 1340 18     Temp 12/06/23 1340 98.1 F (36.7 C)     Temp Source 12/06/23 1340 Oral      SpO2 12/06/23 1340 97 %     Weight --      Height --      Head Circumference --      Peak Flow --      Pain Score 12/06/23 1336 6     Pain Loc --      Pain Education --      Exclude from Growth Chart --    No data found.  Updated Vital Signs BP 112/76 (BP Location: Left Arm)   Pulse 95   Temp 98.1 F (36.7 C) (Oral)   Resp 18   SpO2 97%   Visual Acuity Right  Eye Distance:   Left Eye Distance:   Bilateral Distance:    Right Eye Near:   Left Eye Near:    Bilateral Near:     Physical Exam General: Alert and oriented, well-developed/well-nourished, calm, cooperative, no acute distress HEENT: Normocephalic atraumatic, moist mucous membranes, no scleral icterus, trachea midline, PERRL, no pain with extraocular movement, TMs nonbulging or erythematous or cloudy, tenderness to palpation of the left maxillary sinus Lungs: Speaking full sentences, non-labored respirations, no distress Heart: Regular rate and rhythm Abdomen:  Soft, nondistended Musculoskeletal: Moves all extremities well Neurologic: Awake, A&O x4, gait normal Integumentary: Warm, dry, normal for ethnicity, intact, no rash Psychiatric: Appropriate mood & affect  UC Treatments / Results  Labs (all labs ordered are listed, but only abnormal results are displayed) Labs Reviewed - No data to display  EKG   Radiology No results found.  Procedures Procedures (including critical care time)  Medications Ordered in UC Medications - No data to display  Initial Impression / Assessment and Plan / UC Course  I have reviewed the triage vital signs and the nursing notes.  Pertinent labs & imaging results that were available during my care of the patient were reviewed by me and considered in my medical decision making (see chart for details).    Presents with sinus pain and headache.  Differential diagnosis includes: Migraine headache, tension headache, cluster headache, meningitis, encephalitis, ICH, acute  angle glaucoma, temporal arteritis, sinusitis, viral illness, mass, including other diagnoses.  History obtained from: Patient.  Plan: This patient presents with a headache and sinus pain.  As patient was tender over the maxillary sinuses, prescribed Augmentin  for coverage of possible sinusitis. No headache red flags. Neurologic exam without evidence of meningismus, AMS, focal neurologic findings so doubt meningitis, encephalitis, stroke. Presentation not consistent with acute intracranial bleed to include SAH (lack of risk factors, headache history). No history of trauma so doubt ICH. Given history and physical temporal arteritis unlikely, as is acute angle closure glaucoma. Doubt carotid artery dissection given no focal neuro deficits, no neck trauma or recent neck strain.  Discussed with patient the limitations of urgent care including lack of imaging and labs that would be helpful to rule out more concerning causes of her symptoms. Recommended the patient follow-up with their PCP in the next several days.  Strict return precautions to the urgent care or emergency department were discussed including if they experience worsening of symptoms, neurologic symptoms, develop fever or neck pain, vision changes, facial drooping, slurred speech, loss of sensation, vomiting, or if they have any other concerns.  Disposition: Stable to discharge home.  All questions answered to the best of this examiner's ability. Advised to f/u with PCP for further eval and/or reassessment. Patient agrees to plan.  An appropriate evaluation has been performed, and in my medical judgment there is currently no evidence of an immediate life-threatening or surgical condition. Discharge is therefore indicated at this time.  This document was created using the aid of voice recognition Scientist, clinical (histocompatibility and immunogenetics).  Final Clinical Impressions(s) / UC Diagnoses   Final diagnoses:  Nonintractable headache, unspecified chronicity  pattern, unspecified headache type  Chronic maxillary sinusitis     Discharge Instructions      We have sent in an antibiotic to cover for possible sinus infection.  We recommend following up with your primary care provider in the next several days.  If you are unable to get into your primary care doctor's office in the next several days and symptoms  are not improving on the antibiotic, please go to the emergency department.  Additionally, please go to the emergency department immediately if you have any worsening of symptoms, severe headache, begin vomiting, have changes or loss of vision, facial drooping, slurred speech, weakness, loss of sensation, or if you have any other concerns.   ED Prescriptions     Medication Sig Dispense Auth. Provider   amoxicillin -clavulanate (AUGMENTIN ) 875-125 MG tablet Take 1 tablet by mouth every 12 (twelve) hours. 14 tablet Melonie Locus, PA-C      PDMP not reviewed this encounter.   Melonie Locus, PA-C 12/06/23 1432

## 2023-12-13 DIAGNOSIS — R519 Headache, unspecified: Secondary | ICD-10-CM | POA: Diagnosis not present

## 2023-12-14 DIAGNOSIS — R519 Headache, unspecified: Secondary | ICD-10-CM | POA: Diagnosis not present

## 2024-01-15 DIAGNOSIS — Z23 Encounter for immunization: Secondary | ICD-10-CM | POA: Diagnosis not present

## 2024-01-30 DIAGNOSIS — Z96611 Presence of right artificial shoulder joint: Secondary | ICD-10-CM | POA: Diagnosis not present

## 2024-02-06 DIAGNOSIS — M129 Arthropathy, unspecified: Secondary | ICD-10-CM | POA: Diagnosis not present

## 2024-02-06 DIAGNOSIS — R7303 Prediabetes: Secondary | ICD-10-CM | POA: Diagnosis not present

## 2024-02-06 DIAGNOSIS — E782 Mixed hyperlipidemia: Secondary | ICD-10-CM | POA: Diagnosis not present

## 2024-02-06 DIAGNOSIS — K219 Gastro-esophageal reflux disease without esophagitis: Secondary | ICD-10-CM | POA: Diagnosis not present

## 2024-02-06 DIAGNOSIS — F325 Major depressive disorder, single episode, in full remission: Secondary | ICD-10-CM | POA: Diagnosis not present
